# Patient Record
Sex: Female | Born: 1980 | Race: White | Hispanic: No | Smoking: Never smoker
Health system: Southern US, Community
[De-identification: ages and names within clinical notes are randomized; demographics above are authoritative.]

## PROBLEM LIST (undated history)

## (undated) DIAGNOSIS — T7840XA Allergy, unspecified, initial encounter: Secondary | ICD-10-CM

## (undated) DIAGNOSIS — F419 Anxiety disorder, unspecified: Secondary | ICD-10-CM

## (undated) HISTORY — PX: NO PAST SURGERIES: SHX2092

## (undated) HISTORY — DX: Anxiety disorder, unspecified: F41.9

## (undated) HISTORY — DX: Allergy, unspecified, initial encounter: T78.40XA

---

## 2000-12-20 ENCOUNTER — Other Ambulatory Visit: Admission: RE | Admit: 2000-12-20 | Discharge: 2000-12-20 | Payer: Self-pay | Admitting: Internal Medicine

## 2013-04-10 LAB — HM PAP SMEAR

## 2013-06-12 ENCOUNTER — Ambulatory Visit: Payer: Self-pay | Admitting: Family Medicine

## 2015-01-10 ENCOUNTER — Telehealth: Payer: Self-pay | Admitting: Family Medicine

## 2015-01-10 DIAGNOSIS — F4323 Adjustment disorder with mixed anxiety and depressed mood: Secondary | ICD-10-CM

## 2015-01-10 NOTE — Telephone Encounter (Signed)
Are you ok with one of us sending her in some Xanax. I was going to call but big surprise  I am running really far behind. Thanks.

## 2015-01-10 NOTE — Telephone Encounter (Signed)
Spoke with patients sister over the phone.  They are ok waiting until tomorrow for Rx as it is after 5pm.  She is going to pick up an OTC sleep aid to see if that helps her sister sleep tonight.

## 2015-01-10 NOTE — Telephone Encounter (Signed)
Pt's sister Sue Lushndrea is calling because she would like to speak with a nurse. Pt's husband killed himself last night and pt is having problems dealing with his death. Pt's sister scheduled an appt with PA for tomorrow 01/11/15 but was hoping we could do something for pt before then. Pt hasn't been seen in office since 06/12/13. Thanks TNP

## 2015-01-11 ENCOUNTER — Ambulatory Visit (INDEPENDENT_AMBULATORY_CARE_PROVIDER_SITE_OTHER): Payer: BC Managed Care – PPO | Admitting: Physician Assistant

## 2015-01-11 ENCOUNTER — Encounter: Payer: Self-pay | Admitting: Physician Assistant

## 2015-01-11 VITALS — BP 122/70 | HR 70 | Temp 98.1°F | Resp 16 | Wt 118.6 lb

## 2015-01-11 DIAGNOSIS — F4321 Adjustment disorder with depressed mood: Secondary | ICD-10-CM | POA: Diagnosis not present

## 2015-01-11 DIAGNOSIS — F4323 Adjustment disorder with mixed anxiety and depressed mood: Secondary | ICD-10-CM | POA: Diagnosis not present

## 2015-01-11 DIAGNOSIS — K649 Unspecified hemorrhoids: Secondary | ICD-10-CM | POA: Insufficient documentation

## 2015-01-11 MED ORDER — ALPRAZOLAM 0.25 MG PO TABS: ORAL_TABLET | ORAL | Status: DC

## 2015-01-11 NOTE — Progress Notes (Signed)
   Subjective:    Patient ID: Erin Vance, female    DOB: 11/19/1980, 34 y.o.   MRN: [id number]  HPI Comments: Patient's husband committed suicide on Sunday 01/09/15 with her in the house.  They were on a trial separation and had been seeing a marriage counselor.  He also had a history of alcohol abuse.  He had been drinking all day on Sunday 01/09/15 when she went over to their house.  She has been busy with funeral arrangements and has not really focused on the grieving situation yet.  Her sister accompanies her today and states she was not sleeping well, but finally slept some last night (01/10/15) and that she has not been eating well.  Anxiety Presents for initial visit. Onset was in the past 7 days. The problem has been waxing and waning. Symptoms include depressed mood, excessive worry, insomnia and nervous/anxious behavior. Patient reports no chest pain, compulsions, confusion, decreased concentration, dizziness, dry mouth, feeling of choking, hyperventilation, impotence, irritability, malaise, muscle tension, nausea, obsessions, palpitations, panic, restlessness, shortness of breath or suicidal ideas. Symptoms occur occasionally. The severity of symptoms is moderate. The symptoms are aggravated by family issues. Hours of sleep per night: last night she was able to sleep for the first time since Sunday 01/09/15. The quality of sleep is fair. Nighttime awakenings: none.   Risk factors include a major life event and prior traumatic experience. There is no history of anemia, anxiety/panic attacks, arrhythmia, asthma, bipolar disorder, CAD, CHF, chronic lung disease, depression, fibromyalgia, hyperthyroidism or suicide attempts. Past treatments include nothing.      Review of Systems  Constitutional: Positive for appetite change (no appetite). Negative for irritability, fatigue and unexpected weight change.  Respiratory: Negative for chest tightness and shortness of breath.   Cardiovascular: Negative  for chest pain and palpitations.  Gastrointestinal: Negative for nausea.  Genitourinary: Negative for impotence.  Neurological: Negative for dizziness.  Psychiatric/Behavioral: Positive for sleep disturbance. Negative for suicidal ideas, confusion, self-injury, decreased concentration and agitation. The patient is nervous/anxious and has insomnia.        Objective:   Physical Exam  Constitutional: She appears well-developed and well-nourished. No distress.  Psychiatric: Her speech is normal and behavior is normal. Judgment and thought content normal. Her mood appears not anxious. Her affect is not angry. Cognition and memory are normal. She exhibits a depressed mood.          Assessment & Plan:  1. Situational mixed anxiety and depressive disorder Husband committed suicide on 01/09/15.  Xanax given for prn anxiety.  She has a Veterinary surgeoncounselor that has agreed to see her.  It is the same counselor that she and her husband were seeing for marriage counseling.  I advised her to call if she needs anything in the meantime.  Will follow up in 2 weeks.  - ALPRAZolam (XANAX) 0.25 MG tablet; Take 1-2 tablets as needed every 4 hours for anxiety  Dispense: 30 tablet; Refill: 0  2. Grief reaction Husband committed suicide on 01/09/15.  Xanax given for prn anxiety.  She has a Veterinary surgeoncounselor that has agreed to see her.  It is the same counselor that she and her husband were seeing for marriage counseling.  I advised her to call if she needs anything in the meantime.  Will follow up in 2 weeks.

## 2015-01-11 NOTE — Patient Instructions (Signed)
Grief Reaction Grief is a normal response to the death of someone close to you. Feelings of fear, anger, and guilt can affect almost everyone who loses someone they love. Symptoms of depression are also common. These include problems with sleep, loss of appetite, and lack of energy. These grief reaction symptoms often last for weeks to months after a loss. They may also return during special times that remind you of the person you lost, such as an anniversary or birthday. Anxiety, insomnia, irritability, and deep depression may last beyond the period of normal grief. If you experience these feelings for 6 months or longer, you may have clinical depression. Clinical depression requires further medical attention. If you think that you have clinical depression, you should contact your caregiver. If you have a history of depression or a family history of depression, you are at greater risk of clinical depression. You are also at greater risk of developing clinical depression if the loss was traumatic or the loss was of someone with whom you had unresolved issues.  A grief reaction can become complicated by being blocked. This means being unable to cry or express extreme emotions. This may prolong the grieving period and worsen the emotional effects of the loss. Mourning is a natural event in human life. A healthy grief reaction is one that is not blocked. It requires a time of sadness and readjustment. It is very important to share your sorrow and fear with others, especially close friends and family. Professional counselors and clergy can also help you process your grief. Document Released: 07/23/2005 Document Revised: 12/07/2013 Document Reviewed: 04/02/2006 Exit[employer] Patient Information 2015 [employer]. This information is not intended to replace advice given to you by your health care provider. Make sure you discuss any questions you have with your health care provider.  

## 2015-01-26 ENCOUNTER — Ambulatory Visit (INDEPENDENT_AMBULATORY_CARE_PROVIDER_SITE_OTHER): Payer: BC Managed Care – PPO | Admitting: Physician Assistant

## 2015-01-26 ENCOUNTER — Encounter: Payer: Self-pay | Admitting: Physician Assistant

## 2015-01-26 VITALS — BP 118/60 | HR 80 | Temp 98.1°F | Resp 16 | Ht 61.5 in | Wt 114.6 lb

## 2015-01-26 DIAGNOSIS — F4323 Adjustment disorder with mixed anxiety and depressed mood: Secondary | ICD-10-CM

## 2015-01-26 DIAGNOSIS — Z0000 Encounter for general adult medical examination without abnormal findings: Secondary | ICD-10-CM | POA: Diagnosis not present

## 2015-01-26 NOTE — Progress Notes (Signed)
Patient ID: Erin Vance, female   DOB: 27-Feb-1981, 34 y.o.   MRN: [id number]   Patient: Erin Vance, Female    DOB: 06-Jun-1981, 34 y.o.   MRN: [id number] Visit Date: 01/26/2015  Today's Provider: Margaretann Loveless, PA-C   Chief Complaint  Patient presents with  . Annual Exam  . Medication Refill    needs Aprazolam (Xanax) 0.25 MG tablet   Subjective:    Annual physical exam Erin Vance is a 34 y.o. female who presents today for health maintenance and complete physical. She feels fairly well. She reports not exercising. She reports she is sleeping well, sleeping 7 hours per night.   We discussed in detail how she has been doing since the death of her husband.  She is coping well.  She reports not having to use the xanax except for once since she was given the Rx.  She does report having one flashback of the incident when she was getting her hair done.  The flashback was of her husbands grandfather spraying the driveway off.  She is also still seeing her counselor as well. -----------------------------------------------------------------   Review of Systems  Constitutional: Negative.   HENT: Negative.   Eyes: Negative.   Respiratory: Negative.   Cardiovascular: Negative.   Gastrointestinal: Negative.   Endocrine: Negative.   Genitourinary: Negative.   Musculoskeletal: Negative.   Skin: Negative.   Allergic/Immunologic: Negative.   Neurological: Negative.   Hematological: Negative.   Psychiatric/Behavioral: Negative.     Social History She  reports that she has never smoked. She does not have any smokeless tobacco history on file. She reports that she drinks alcohol. She reports that she does not use illicit drugs.  Patient Active Problem List   Diagnosis Date Noted  . Hemorrhoid 01/11/2015  . Situational mixed anxiety and depressive disorder 01/11/2015    Past Surgical History  Procedure Laterality Date  . No past surgeries      Family History Her family  history includes Bell's palsy in her sister; Breast cancer in her maternal grandmother; Diabetes in her sister; Heart attack in her father and paternal grandfather; Hypertension in her mother; Kidney cancer (age of onset: 29) in her mother.    Previous Medications   ALPRAZOLAM (XANAX) 0.25 MG TABLET    Take 1-2 tablets as needed every 4 hours for anxiety    Patient Care Team: Margaretann Loveless, PA-C as PCP - General (Physician Assistant)     Objective:   Vitals: BP 118/60 mmHg  Pulse 80  Temp(Src) 98.1 F (36.7 C) (Oral)  Resp 16  Ht 5' 1.5" (1.562 m)  Wt 114 lb 9.6 oz (51.982 kg)  BMI 21.31 kg/m2  LMP 12/11/2014   Physical Exam  Constitutional: She is oriented to person, place, and time. She appears well-developed and well-nourished. No distress.  HENT:  Head: Normocephalic and atraumatic.  Right Ear: Hearing, tympanic membrane, external ear and ear canal normal.  Left Ear: Hearing, tympanic membrane, external ear and ear canal normal.  Nose: Nose normal.  Mouth/Throat: Uvula is midline, oropharynx is clear and moist and mucous membranes are normal. No oropharyngeal exudate.  Eyes: Conjunctivae are normal. Pupils are equal, round, and reactive to light. Right eye exhibits no discharge. Left eye exhibits no discharge. No scleral icterus.  Neck: Normal range of motion. Neck supple. No tracheal deviation present. No thyromegaly present.  Cardiovascular: Normal rate, regular rhythm, normal heart sounds and intact distal pulses.  Exam reveals no gallop and no friction rub.  No murmur heard. Pulmonary/Chest: Effort normal and breath sounds normal. No respiratory distress. She has no wheezes. She has no rales. She exhibits no tenderness.  Abdominal: Soft. Bowel sounds are normal. She exhibits no distension and no mass. There is no tenderness. There is no rebound and no guarding.  Musculoskeletal: Normal range of motion. She exhibits no edema or tenderness.  Lymphadenopathy:    She  has no cervical adenopathy.  Neurological: She is alert and oriented to person, place, and time. No cranial nerve deficit.  Skin: Skin is warm and dry. No rash noted. She is not diaphoretic.  Psychiatric: She has a normal mood and affect. Her speech is normal and behavior is normal. Judgment and thought content normal. Cognition and memory are normal.  Vitals reviewed.    Depression Screen PHQ 2/9 Scores 01/26/2015  PHQ - 2 Score 0      Assessment & Plan:     Routine Health Maintenance and Physical Exam  Exercise Activities and Dietary recommendations Goals    None      Immunization History  Administered Date(s) Administered  . HPV Quadrivalent 07/12/2006, 09/19/2006, 01/22/2007  . Tdap 07/12/2006    Health Maintenance  Topic Date Due  . HIV Screening  08/14/1995  . INFLUENZA VACCINE  03/07/2015  . PAP SMEAR  04/10/2016  . TETANUS/TDAP  07/12/2016      Discussed health benefits of physical activity, and encouraged her to engage in regular exercise appropriate for her age and condition.   1. Routine general medical examination at a health care facility Pap deferred to next year.  2. Situational mixed anxiety and depressive disorder Seems to be coping appropriately.  Will follow up in 4 weeks.    --------------------------------------------------------------------

## 2015-01-26 NOTE — Patient Instructions (Signed)

## 2015-02-23 ENCOUNTER — Ambulatory Visit (INDEPENDENT_AMBULATORY_CARE_PROVIDER_SITE_OTHER): Payer: BC Managed Care – PPO | Admitting: Physician Assistant

## 2015-02-23 ENCOUNTER — Encounter: Payer: Self-pay | Admitting: Physician Assistant

## 2015-02-23 VITALS — BP 102/64 | HR 60 | Temp 98.3°F | Resp 14 | Ht 68.0 in | Wt 116.8 lb

## 2015-02-23 DIAGNOSIS — F4321 Adjustment disorder with depressed mood: Secondary | ICD-10-CM | POA: Diagnosis not present

## 2015-02-23 DIAGNOSIS — F4323 Adjustment disorder with mixed anxiety and depressed mood: Secondary | ICD-10-CM | POA: Diagnosis not present

## 2015-02-23 NOTE — Progress Notes (Signed)
Subjective:    Patient ID: Erin Vance, female    DOB: Jun 03, 1981, 34 y.o.   MRN: [id number]  HPI Comments: Patient's husband committed suicide on Sunday 01/09/15 with her in the house.  They were on a trial separation and had been seeing a marriage counselor.  He also had a history of alcohol abuse.  He had been drinking all day on Sunday 01/09/15 when she went over to their house.  She has been busy with funeral arrangements and has not really focused on the grieving situation yet.  Her sister accompanies her today and states she was not sleeping well, but finally slept some last night (01/10/15) and that she has not been eating well.  Anxiety Presents for follow-up visit. Onset was in the past 7 days. The problem has been waxing and waning. Symptoms include depressed mood, excessive worry, insomnia and nervous/anxious behavior. Patient reports no chest pain, compulsions, confusion, decreased concentration, dizziness, dry mouth, feeling of choking, hyperventilation, impotence, irritability, malaise, muscle tension, nausea, obsessions, palpitations, panic, restlessness, shortness of breath or suicidal ideas. Symptoms occur occasionally. The severity of symptoms is moderate. The symptoms are aggravated by family issues. Hours of sleep per night: last night she was able to sleep for the first time since Sunday 01/09/15. The quality of sleep is fair. Nighttime awakenings: none.   Risk factors include a major life event and prior traumatic experience. There is no history of anemia, anxiety/panic attacks, arrhythmia, asthma, bipolar disorder, CAD, CHF, chronic lung disease, depression, fibromyalgia, hyperthyroidism or suicide attempts. Past treatments include nothing. Compliance with medications is 0-25%.   UPDATE:  She feels she is doing ok.  She reports having episodes of sadness daily, but they are fleeting.  She does have a good support system with friends and family.  She continues to see the grief  counselor weekly.  She has not been taking the xanax.  She only used 2 pills total.  Currently she has been thinking a lot about the "what ifs" and if anything could have changed the outcome.  She knows and understands these thoughts will not, nor can they change the outcome, but she can't help fixating on them.  She is trying to stay busy.  She is continuing to have sleep issues stating she stays awake and stays busy until almost exhaustion around 12am to 1am.  She will then fall asleep and sometimes sleep through the night until around 7am and sometimes she will awake in the middle of the night.  She does state she is able to fall back asleep though.   Review of Systems  Constitutional: Negative for appetite change (improved some), irritability, fatigue and unexpected weight change.  Respiratory: Negative for chest tightness and shortness of breath.   Cardiovascular: Negative for chest pain and palpitations.  Gastrointestinal: Negative for nausea.  Genitourinary: Negative for impotence.  Neurological: Negative for dizziness.  Psychiatric/Behavioral: Positive for sleep disturbance. Negative for suicidal ideas, confusion, self-injury, decreased concentration and agitation. The patient is nervous/anxious and has insomnia.        Objective:   Physical Exam  Constitutional: She appears well-developed and well-nourished. No distress.  Skin: She is not diaphoretic.  Psychiatric: Her speech is normal and behavior is normal. Judgment and thought content normal. Her mood appears not anxious. Her affect is not angry. Cognition and memory are normal. She exhibits a depressed mood.  She seems to be doing well.  She has occasional tearfulness during the interview.  She does have some anger  towards her husbands family and some blame.  She is asking a lot of "what if" questions.  She does continue to want to have a relationship with them, and has been trying to reach out to them without much response.           Assessment & Plan:  1. Situational mixed anxiety and depressive disorder Husband committed suicide on 01/09/15.  Xanax given for prn anxiety.  She has a Veterinary surgeoncounselor that has agreed to see her.  It is the same counselor that she and her husband were seeing for marriage counseling.  I advised her to call if she needs anything in the meantime.  Will follow up in 4 weeks.  2. Grief reaction See above.

## 2015-02-23 NOTE — Patient Instructions (Signed)
Grief Reaction Grief is a normal response to the death of someone close to you. Feelings of fear, anger, and guilt can affect almost everyone who loses someone they love. Symptoms of depression are also common. These include problems with sleep, loss of appetite, and lack of energy. These grief reaction symptoms often last for weeks to months after a loss. They may also return during special times that remind you of the person you lost, such as an anniversary or birthday. Anxiety, insomnia, irritability, and deep depression may last beyond the period of normal grief. If you experience these feelings for 6 months or longer, you may have clinical depression. Clinical depression requires further medical attention. If you think that you have clinical depression, you should contact your caregiver. If you have a history of depression or a family history of depression, you are at greater risk of clinical depression. You are also at greater risk of developing clinical depression if the loss was traumatic or the loss was of someone with whom you had unresolved issues.  A grief reaction can become complicated by being blocked. This means being unable to cry or express extreme emotions. This may prolong the grieving period and worsen the emotional effects of the loss. Mourning is a natural event in human life. A healthy grief reaction is one that is not blocked. It requires a time of sadness and readjustment. It is very important to share your sorrow and fear with others, especially close friends and family. Professional counselors and clergy can also help you process your grief. Document Released: 07/23/2005 Document Revised: 12/07/2013 Document Reviewed: 04/02/2006 Exit[employer] Patient Information 2015 [employer]. This information is not intended to replace advice given to you by your health care provider. Make sure you discuss any questions you have with your health care provider.  

## 2015-03-11 ENCOUNTER — Ambulatory Visit (INDEPENDENT_AMBULATORY_CARE_PROVIDER_SITE_OTHER): Payer: BC Managed Care – PPO | Admitting: Physician Assistant

## 2015-03-11 ENCOUNTER — Encounter: Payer: Self-pay | Admitting: Physician Assistant

## 2015-03-11 VITALS — BP 100/60 | HR 66 | Temp 97.5°F | Resp 16 | Wt 116.2 lb

## 2015-03-11 DIAGNOSIS — Z01419 Encounter for gynecological examination (general) (routine) without abnormal findings: Secondary | ICD-10-CM

## 2015-03-11 DIAGNOSIS — Z124 Encounter for screening for malignant neoplasm of cervix: Secondary | ICD-10-CM

## 2015-03-11 DIAGNOSIS — F4323 Adjustment disorder with mixed anxiety and depressed mood: Secondary | ICD-10-CM

## 2015-03-11 MED ORDER — ALPRAZOLAM 0.25 MG PO TABS: ORAL_TABLET | ORAL | Status: DC

## 2015-03-11 NOTE — Patient Instructions (Signed)
Grief Reaction Grief is a normal response to the death of someone close to you. Feelings of fear, anger, and guilt can affect almost everyone who loses someone they love. Symptoms of depression are also common. These include problems with sleep, loss of appetite, and lack of energy. These grief reaction symptoms often last for weeks to months after a loss. They may also return during special times that remind you of the person you lost, such as an anniversary or birthday. Anxiety, insomnia, irritability, and deep depression may last beyond the period of normal grief. If you experience these feelings for 6 months or longer, you may have clinical depression. Clinical depression requires further medical attention. If you think that you have clinical depression, you should contact your caregiver. If you have a history of depression or a family history of depression, you are at greater risk of clinical depression. You are also at greater risk of developing clinical depression if the loss was traumatic or the loss was of someone with whom you had unresolved issues.  A grief reaction can become complicated by being blocked. This means being unable to cry or express extreme emotions. This may prolong the grieving period and worsen the emotional effects of the loss. Mourning is a natural event in human life. A healthy grief reaction is one that is not blocked. It requires a time of sadness and readjustment. It is very important to share your sorrow and fear with others, especially close friends and family. Professional counselors and clergy can also help you process your grief. Document Released: 07/23/2005 Document Revised: 12/07/2013 Document Reviewed: 04/02/2006 Exit[employer] Patient Information 2015 [employer]. This information is not intended to replace advice given to you by your health care provider. Make sure you discuss any questions you have with your health care provider.  

## 2015-03-11 NOTE — Progress Notes (Signed)
Patient: Erin Vance Female    DOB: 28-May-1981   34 y.o.   MRN: [id number] Visit Date: 03/11/2015  Today's Provider: Margaretann Loveless, PA-C   Chief Complaint  Patient presents with  . Follow-up    Depression and Anxiety   Subjective:    Anxiety Presents for follow-up visit. Onset was 1 to 6 months ago. The problem has been rapidly improving. Symptoms include depressed mood (sometimes), insomnia (sometimes) and nervous/anxious behavior. Patient reports no chest pain, confusion, decreased concentration, dizziness, dry mouth, excessive worry, irritability, malaise, nausea, panic, restlessness, shortness of breath or suicidal ideas. Symptoms occur rarely. The most recent episode lasted 2 minutes. The severity of symptoms is mild. Nothing aggravates the symptoms. The quality of sleep is fair.    Gynecologic Exam The patient's pertinent negatives include no genital itching, genital lesions, genital odor, genital rash, missed menses, pelvic pain, vaginal bleeding or vaginal discharge. The patient is experiencing no pain. She is not pregnant. Pertinent negatives include no abdominal pain, anorexia, back pain, chills, constipation, diarrhea, discolored urine, dysuria, fever, flank pain, frequency, headaches, hematuria, joint pain, joint swelling, nausea, painful intercourse, rash, sore throat, urgency or vomiting. She is not sexually active. Her menstrual history has been regular. There is no history of an abdominal surgery, a Cesarean section, an ectopic pregnancy, endometriosis, a gynecological surgery, herpes simplex, menorrhagia, metrorrhagia, miscarriage, ovarian cysts, perineal abscess, PID, an STD, a terminated pregnancy or vaginosis.       Allergies  Allergen Reactions  . Cephalexin    Previous Medications   ALPRAZOLAM (XANAX) 0.25 MG TABLET    Take 1-2 tablets as needed every 4 hours for anxiety    Review of Systems  Constitutional: Negative.  Negative for fever, chills  and irritability.  HENT: Negative.  Negative for sore throat.   Eyes: Negative.   Respiratory: Negative.  Negative for shortness of breath.   Cardiovascular: Negative.  Negative for chest pain.  Gastrointestinal: Negative.  Negative for nausea, vomiting, abdominal pain, diarrhea, constipation and anorexia.  Endocrine: Negative.   Genitourinary: Negative.  Negative for dysuria, urgency, frequency, hematuria, flank pain, vaginal discharge, pelvic pain, menorrhagia and missed menses.  Musculoskeletal: Negative for back pain and joint pain.  Skin: Negative.  Negative for rash.  Allergic/Immunologic: Negative.   Neurological: Negative.  Negative for dizziness and headaches.  Hematological: Negative.   Psychiatric/Behavioral: Negative for suicidal ideas, confusion, self-injury and decreased concentration. The patient is nervous/anxious and has insomnia (sometimes).     History  Substance Use Topics  . Smoking status: Never Smoker   . Smokeless tobacco: Not on file  . Alcohol Use: 0.0 oz/week    0 Standard drinks or equivalent per week     Comment: occasionally   Objective:   BP 100/60 mmHg  Pulse 66  Temp(Src) 97.5 F (36.4 C) (Oral)  Resp 16  Wt 116 lb 3.2 oz (52.708 kg)  LMP 02/20/2015 (Exact Date)  Physical Exam  Constitutional: She appears well-developed and well-nourished. No distress.  Pulmonary/Chest: Right breast exhibits no inverted nipple, no mass, no nipple discharge, no skin change and no tenderness. Left breast exhibits no inverted nipple, no mass, no nipple discharge, no skin change and no tenderness. Breasts are symmetrical.  Breast tissue is dense bilaterally  Genitourinary: Rectum normal, vagina normal and uterus normal. Pelvic exam was performed with patient supine. No labial fusion. There is no rash, tenderness, lesion or injury on the right labia. There is no rash, tenderness, lesion  or injury on the left labia. Uterus is not deviated, not enlarged, not fixed and  not tender. Cervix exhibits no motion tenderness, no discharge and no friability. Right adnexum displays no mass, no tenderness and no fullness. Left adnexum displays no mass, no tenderness and no fullness. No vaginal discharge found.  Skin: She is not diaphoretic.  Psychiatric: She has a normal mood and affect. Her behavior is normal. Judgment and thought content normal.  Vitals reviewed.       Assessment & Plan:     1. Encounter for gynecological examination At last annual exam we have decided to defer her Pap smear until the following year due to the current situation that she was in. At most recent visit we decided that we would go ahead and do the Pap smear this year as she was feeling okay and was ready to have it done, and that she was due for her Pap smear. Pap smear was done today in the office. I will call her with the results once I receive them. - Pap IG w/ reflex to HPV when [facility]-U  2. Situational mixed anxiety and depressive disorder Stable. She still has good days and bad days. She has been using the Xanax a little more frequently when she feels nervous and anxious about an upcoming activity. She still takes them very infrequently stating that she has only taken 3 pills since I last saw her. Each time she took them was due to having increased anxiety for different social situations. She does feel that she may require it a little more frequently once school starts back. Will refill Xanax as below. We'll follow-up in 2 months to see how she is doing with starting back work. She is to call the office if she has any worsening symptoms, suicidal thoughts, or severe depression for follow-up if needed sooner. - ALPRAZolam (XANAX) 0.25 MG tablet; Take 1-2 tablets as needed every 4 hours for anxiety  Dispense: 30 tablet; Refill: 0  3. Cervical cancer screening - Pap IG w/ reflex to HPV when [facility]       Margaretann Loveless, PA-C  [facility]

## 2015-03-16 ENCOUNTER — Telehealth: Payer: Self-pay

## 2015-03-16 LAB — PAP IG W/ RFLX HPV ASCU: PAP Smear Comment: 0

## 2015-03-16 NOTE — Telephone Encounter (Signed)
-----   Message from Margaretann Loveless, [place] sent at 03/16/2015  1:46 PM EDT ----- Normal pap smear.

## 2015-03-16 NOTE — Telephone Encounter (Signed)
No answer un able to LM. 03/16/2015  Thanks,  -Joseline

## 2015-03-16 NOTE — Telephone Encounter (Signed)
Patient advised as directed below.  Thanks,  -Joseline 

## 2015-05-25 ENCOUNTER — Ambulatory Visit (INDEPENDENT_AMBULATORY_CARE_PROVIDER_SITE_OTHER): Payer: BC Managed Care – PPO | Admitting: Physician Assistant

## 2015-05-25 ENCOUNTER — Encounter: Payer: Self-pay | Admitting: Physician Assistant

## 2015-05-25 VITALS — BP 112/68 | HR 70 | Temp 98.2°F | Resp 16 | Wt 120.8 lb

## 2015-05-25 DIAGNOSIS — F4323 Adjustment disorder with mixed anxiety and depressed mood: Secondary | ICD-10-CM | POA: Diagnosis not present

## 2015-05-25 DIAGNOSIS — G479 Sleep disorder, unspecified: Secondary | ICD-10-CM

## 2015-05-25 DIAGNOSIS — F4321 Adjustment disorder with depressed mood: Secondary | ICD-10-CM | POA: Diagnosis not present

## 2015-05-25 MED ORDER — ALPRAZOLAM 0.25 MG PO TABS: ORAL_TABLET | ORAL | Status: DC

## 2015-05-25 MED ORDER — ESCITALOPRAM OXALATE 10 MG PO TABS: ORAL_TABLET | ORAL | Status: DC

## 2015-05-25 NOTE — Progress Notes (Signed)
Patient: Erin Vance Female    DOB: 03/23/1981   34 y.o.   MRN: [id number] Visit Date: 05/25/2015  Today's Provider: Margaretann LovelessJennifer M Burnette, PA-C   Chief Complaint  Patient presents with  . Anxiety   Subjective:    HPI    Anxiety: Patient following on anxiety.  She has the following symptoms: none. Onset of symptoms was approximately 4 months ago, unchanged since last office visit. She denies current suicidal and homicidal ideation. Family history significant for none.Previous treatment includes Xanax  She complains of the following side effects from the treatment: none. Per patient some days starts to cry, feeling sad, very emotional. Patient went back to work, but states that has the flash backs and gets upset but is not work related just the memories.Patient is taking Xanax as needed.   She states that she felt she was doing fairly well but on October 5 her husband's headstone was placed in the ground. She states at that time she felt that the finality of his death. Since then she feels like she has been having a harder time coping. She also has had days where she has felt like she did not want to get out of bed. She also has not been going to her counselor in the last couple months due to extracurricular activities with her school. She states that these 1 and in November and she is looking forward to getting back with her counselor. Overall she states she feels she is doing okay but she is interested and medication for depression at this time.  She also continues to have difficulty with sleep. She states that she does have good days and bad days. She has not been taking anything to help her sleep at this time.   Allergies  Allergen Reactions  . Cephalexin    Previous Medications   ALPRAZOLAM (XANAX) 0.25 MG TABLET    Take 1-2 tablets as needed every 4 hours for anxiety    Review of Systems  Constitutional: Negative for activity change, appetite change and unexpected weight  change.  Respiratory: Negative for cough, chest tightness, shortness of breath and wheezing.   Cardiovascular: Negative for chest pain and palpitations.  Gastrointestinal: Negative for nausea and vomiting.  Neurological: Negative for dizziness, syncope, weakness, light-headedness and headaches.  Psychiatric/Behavioral: Positive for dysphoric mood. The patient is nervous/anxious.     Social History  Substance Use Topics  . Smoking status: Never Smoker   . Smokeless tobacco: Not on file  . Alcohol Use: 0.0 oz/week    0 Standard drinks or equivalent per week     Comment: occasionally   Objective:   BP 112/68 mmHg  Pulse 70  Temp(Src) 98.2 F (36.8 C) (Oral)  Resp 16  Wt 120 lb 12.8 oz (54.795 kg)  LMP 04/25/2015  Physical Exam  Constitutional: She appears well-developed and well-nourished. No distress.  Cardiovascular: Normal rate, regular rhythm and normal heart sounds.  Exam reveals no gallop and no friction rub.   No murmur heard. Pulmonary/Chest: Effort normal and breath sounds normal. No respiratory distress. She has no wheezes. She has no rales.  Skin: She is not diaphoretic.  Psychiatric: Her speech is normal and behavior is normal. Judgment and thought content normal. Her mood appears anxious. Cognition and memory are normal. She exhibits a depressed mood. She expresses no homicidal and no suicidal ideation.  Vitals reviewed.       Assessment & Plan:     1. Situational  mixed anxiety and depressive disorder Advised her to start taking the Xanax nightly to help her sleep. I will also prescribe her Lexapro as below for the increased depression symptoms that she is starting to develop. She is to take this at night as well. I do hope that this will help with her trouble sleeping. We also discussed her getting back him off with her counselor. She agrees to do this. We also discussed possibly looking into grief groups to help her with coping strategies. We will readdress this in  4 weeks when she returns for follow-up. At that time if she is interested or if any time prior to that visit she decides she would like to look into grief counseling groups I will assist her with this. I will have her return in 4 weeks to see how she is doing with the Lexapro and Xanax at night. She is to call the office if she has any side effects or worsening of her symptoms in the meantime. - ALPRAZolam (XANAX) 0.25 MG tablet; Take 1-2 tablets as needed every 4 hours for anxiety  Dispense: 30 tablet; Refill: 1 - escitalopram (LEXAPRO) 10 MG tablet; Take 1/2 tab PO q h.s. X 1 week, then 1 tab PO q h.s.  Dispense: 30 tablet; Refill: 1  2. Grief reaction See above medical treatment plan.  3. Trouble in sleeping See above medical treatment plan.  I spent approximately 45 minutes with the patient today. Over 50% of this time was spent with counseling and educating the patient.       Margaretann Loveless, PA-C  [facility]

## 2015-05-25 NOTE — Patient Instructions (Addendum)
Complicated Grieving Grief is a normal response to the death of someone close to you. Feelings of fear, anger, and guilt can affect almost everyone who loses a loved one. It is also common to have symptoms of depression while you are grieving. These include problems with sleep, loss of appetite, and lack of energy. They may last for weeks or months after a loss. Complicated grief is different from normal grief or depression. Normal grieving involves sadness and feelings of loss, but these feelings are not constant. Complicated grief is a constant and severe type of grief. It interferes with your ability to function normally. It may last for several months to a year or longer. Complicated grief may require treatment from a mental health care provider. CAUSES  It is not known why some people continue to struggle with grief and others do not. You may be at higher risk for complicated grief if:  The death of your loved one was sudden or unexpected.  The death of your loved one was due to a violent event.  Your loved one committed suicide.  Your loved one was a child or a young person.  You were very close to or dependent on the loved one.  You have a history of depression. SIGNS AND SYMPTOMS Signs and symptoms of complicated grief may include:  Feeling disbelief or numbness.  Being unable to enjoy good memories of your loved one.  Needing to avoid anything that reminds you of your loved one.  Being unable to stop thinking about the death.  Feeling intense anger or guilt.  Feeling alone and hopeless.  Feeling that your life is meaningless and empty.  Losing the desire to live. DIAGNOSIS Your health care provider may diagnose complicated grief if:  You have constant symptoms of grief for 6-12 months or longer.  Your symptoms are interfering with your ability to live your life. Your health care provider may want you to see a mental health care provider. Many symptoms of depression  are similar to the symptoms of complicated grief. It is important to be evaluated for complicated grief along with other mental health conditions. TREATMENT  Talk therapy with a mental health provider is the most common treatment for complicated grief. During therapy, you will learn healthy ways to cope with the loss of your loved one. In some cases, your mental health care provider may also recommend antidepressant medicines. HOME CARE INSTRUCTIONS  Take care of yourself.  Eat regular meals and maintain a healthy diet. Eat plenty of fruits, vegetables, and whole grains.  Try to get some exercise each day.  Keep regular hours for sleep. Try to get at least 8 hours of sleep each night.  Do not use drugs or alcohol to ease your symptoms.  Take medicines only as directed by your health care provider.  Spend time with friends and loved ones.  Consider joining a grief (bereavement) support group to help you deal with your loss.  Keep all follow-up visits as directed by your health care provider. This is important. SEEK MEDICAL CARE IF:  Your symptoms keep you from functioning normally.  Your symptoms do not get better with treatment. SEEK IMMEDIATE MEDICAL CARE IF:  You have serious thoughts of hurting yourself or someone else.  You have suicidal feelings.   This information is not intended to replace advice given to you by your health care provider. Make sure you discuss any questions you have with your health care provider.   Document Released: 07/23/2005  Document Revised: 04/13/2015 Document Reviewed: 12/31/2013 Elsevier Interactive Patient Education 2016 Elsevier Inc.  Escitalopram tablets What is this medicine? ESCITALOPRAM (es sye TAL oh pram) is used to treat depression and certain types of anxiety. This medicine may be used for other purposes; ask your health care provider or pharmacist if you have questions. What should I tell my health care provider before I take this  medicine? They need to know if you have any of these conditions: -bipolar disorder or a family history of bipolar disorder -diabetes -glaucoma -heart disease -kidney or liver disease -receiving electroconvulsive therapy -seizures (convulsions) -suicidal thoughts, plans, or attempt by you or a family member -an unusual or allergic reaction to escitalopram, the related drug citalopram, other medicines, foods, dyes, or preservatives -pregnant or trying to become pregnant -breast-feeding How should I use this medicine? Take this medicine by mouth with a glass of water. Follow the directions on the prescription label. You can take it with or without food. If it upsets your stomach, take it with food. Take your medicine at regular intervals. Do not take it more often than directed. Do not stop taking this medicine suddenly except upon the advice of your doctor. Stopping this medicine too quickly may cause serious side effects or your condition may worsen. A special MedGuide will be given to you by the pharmacist with each prescription and refill. Be sure to read this information carefully each time. Talk to your pediatrician regarding the use of this medicine in children. Special care may be needed. Overdosage: If you think you have taken too much of this medicine contact a poison control center or emergency room at once. NOTE: This medicine is only for you. Do not share this medicine with others. What if I miss a dose? If you miss a dose, take it as soon as you can. If it is almost time for your next dose, take only that dose. Do not take double or extra doses. What may interact with this medicine? Do not take this medicine with any of the following medications: -certain medicines for fungal infections like fluconazole, itraconazole, ketoconazole, posaconazole, voriconazole -cisapride -citalopram -dofetilide -dronedarone -linezolid -MAOIs like Carbex, Eldepryl, Marplan, Nardil, and  Parnate -methylene blue (injected into a vein) -pimozide -thioridazine -ziprasidone This medicine may also interact with the following medications: -alcohol -aspirin and aspirin-like medicines -carbamazepine -certain medicines for depression, anxiety, or psychotic disturbances -certain medicines for migraine headache like almotriptan, eletriptan, frovatriptan, naratriptan, rizatriptan, sumatriptan, zolmitriptan -certain medicines for sleep -certain medicines that treat or prevent blood clots like warfarin, enoxaparin, dalteparin -cimetidine -diuretics -fentanyl -furazolidone -isoniazid -lithium -metoprolol -NSAIDs, medicines for pain and inflammation, like ibuprofen or naproxen -other medicines that prolong the QT interval (cause an abnormal heart rhythm) -procarbazine -rasagiline -supplements like St. John's wort, kava kava, valerian -tramadol -tryptophan This list may not describe all possible interactions. Give your health care provider a list of all the medicines, herbs, non-prescription drugs, or dietary supplements you use. Also tell them if you smoke, drink alcohol, or use illegal drugs. Some items may interact with your medicine. What should I watch for while using this medicine? Tell your doctor if your symptoms do not get better or if they get worse. Visit your doctor or health care professional for regular checks on your progress. Because it may take several weeks to see the full effects of this medicine, it is important to continue your treatment as prescribed by your doctor. Patients and their families should watch out for new or worsening  thoughts of suicide or depression. Also watch out for sudden changes in feelings such as feeling anxious, agitated, panicky, irritable, hostile, aggressive, impulsive, severely restless, overly excited and hyperactive, or not being able to sleep. If this happens, especially at the beginning of treatment or after a change in dose, call  your health care professional. Bonita Quin may get drowsy or dizzy. Do not drive, use machinery, or do anything that needs mental alertness until you know how this medicine affects you. Do not stand or sit up quickly, especially if you are an older patient. This reduces the risk of dizzy or fainting spells. Alcohol may interfere with the effect of this medicine. Avoid alcoholic drinks. Your mouth may get dry. Chewing sugarless gum or sucking hard candy, and drinking plenty of water may help. Contact your doctor if the problem does not go away or is severe. What side effects may I notice from receiving this medicine? Side effects that you should report to your doctor or health care professional as soon as possible: -allergic reactions like skin rash, itching or hives, swelling of the face, lips, or tongue -confusion -feeling faint or lightheaded, falls -fast talking and excited feelings or actions that are out of control -hallucination, loss of contact with reality -seizures -suicidal thoughts or other mood changes -unusual bleeding or bruising Side effects that usually do not require medical attention (report to your doctor or health care professional if they continue or are bothersome): -blurred vision -changes in appetite -change in sex drive or performance -headache -increased sweating -nausea This list may not describe all possible side effects. Call your doctor for medical advice about side effects. You may report side effects to FDA at 1-800-FDA-1088. Where should I keep my medicine? Keep out of reach of children. Store at room temperature between 15 and 30 degrees C (59 and 86 degrees F). Throw away any unused medicine after the expiration date. NOTE: This sheet is a summary. It may not cover all possible information. If you have questions about this medicine, talk to your doctor, pharmacist, or health care provider.    2016, Elsevier/Gold Standard. (2013-02-17 12:32:55)

## 2015-06-22 ENCOUNTER — Encounter: Payer: Self-pay | Admitting: Physician Assistant

## 2015-06-22 ENCOUNTER — Ambulatory Visit (INDEPENDENT_AMBULATORY_CARE_PROVIDER_SITE_OTHER): Payer: BC Managed Care – PPO | Admitting: Physician Assistant

## 2015-06-22 VITALS — BP 100/68 | HR 78 | Temp 98.2°F | Resp 16 | Wt 123.3 lb

## 2015-06-22 DIAGNOSIS — F4321 Adjustment disorder with depressed mood: Secondary | ICD-10-CM

## 2015-06-22 DIAGNOSIS — F4323 Adjustment disorder with mixed anxiety and depressed mood: Secondary | ICD-10-CM

## 2015-06-22 NOTE — Patient Instructions (Signed)
Complicated Grieving Grief is a normal response to the death of someone close to you. Feelings of fear, anger, and guilt can affect almost everyone who loses a loved one. It is also common to have symptoms of depression while you are grieving. These include problems with sleep, loss of appetite, and lack of energy. They may last for weeks or months after a loss. Complicated grief is different from normal grief or depression. Normal grieving involves sadness and feelings of loss, but these feelings are not constant. Complicated grief is a constant and severe type of grief. It interferes with your ability to function normally. It may last for several months to a year or longer. Complicated grief may require treatment from a mental health care provider. CAUSES  It is not known why some people continue to struggle with grief and others do not. You may be at higher risk for complicated grief if:  The death of your loved one was sudden or unexpected.  The death of your loved one was due to a violent event.  Your loved one committed suicide.  Your loved one was a child or a young person.  You were very close to or dependent on the loved one.  You have a history of depression. SIGNS AND SYMPTOMS Signs and symptoms of complicated grief may include:  Feeling disbelief or numbness.  Being unable to enjoy good memories of your loved one.  Needing to avoid anything that reminds you of your loved one.  Being unable to stop thinking about the death.  Feeling intense anger or guilt.  Feeling alone and hopeless.  Feeling that your life is meaningless and empty.  Losing the desire to live. DIAGNOSIS Your health care provider may diagnose complicated grief if:  You have constant symptoms of grief for 6-12 months or longer.  Your symptoms are interfering with your ability to live your life. Your health care provider may want you to see a mental health care provider. Many symptoms of depression  are similar to the symptoms of complicated grief. It is important to be evaluated for complicated grief along with other mental health conditions. TREATMENT  Talk therapy with a mental health provider is the most common treatment for complicated grief. During therapy, you will learn healthy ways to cope with the loss of your loved one. In some cases, your mental health care provider may also recommend antidepressant medicines. HOME CARE INSTRUCTIONS  Take care of yourself.  Eat regular meals and maintain a healthy diet. Eat plenty of fruits, vegetables, and whole grains.  Try to get some exercise each day.  Keep regular hours for sleep. Try to get at least 8 hours of sleep each night.  Do not use drugs or alcohol to ease your symptoms.  Take medicines only as directed by your health care provider.  Spend time with friends and loved ones.  Consider joining a grief (bereavement) support group to help you deal with your loss.  Keep all follow-up visits as directed by your health care provider. This is important. SEEK MEDICAL CARE IF:  Your symptoms keep you from functioning normally.  Your symptoms do not get better with treatment. SEEK IMMEDIATE MEDICAL CARE IF:  You have serious thoughts of hurting yourself or someone else.  You have suicidal feelings.   This information is not intended to replace advice given to you by your health care provider. Make sure you discuss any questions you have with your health care provider.   Document Released: 07/23/2005   Document Revised: 04/13/2015 Document Reviewed: 12/31/2013 Elsevier Interactive Patient Education 2016 Elsevier Inc.  

## 2015-06-22 NOTE — Progress Notes (Signed)
Patient: Erin Vance Female    DOB: [birth year]   34 y.o.   MRN: [id number] Visit Date: 06/22/2015  Today's Provider: Margaretann Loveless, PA-C   Chief Complaint  Patient presents with  . Depression    Follow up  . Anxiety   Subjective:    Depression      The patient presents with depression.  Chronicity:  Patient here for her 4 wk follow-up.  The current episode started more than 1 month ago.   The problem occurs daily (Patient states not taking Lexapro for two weeks because is feeling more tired and hard to get up.).  The most recent episode lasted 8 hours (Per patient took the Lexpro on Nov the 8th because was very upset.).    The problem has been gradually improving (Depends on the day) since onset.  Associated symptoms include decreased concentration (not as bad as before), fatigue, insomnia (sometimes), irritable (sometimes), appetite change (eating more) and sad (sometimes).     The symptoms are aggravated by work stress, social issues and family issues.  Compliance with treatment is good.  Past medical history includes anxiety and depression.   Anxiety Presents for follow-up visit. Onset was 1 to 6 months ago. The problem has been gradually improving. Symptoms include decreased concentration (not as bad as before), insomnia (sometimes), irritability and nervous/anxious behavior. Patient reports no excessive worry, panic or shortness of breath. Symptoms occur occasionally (Since last office visit patient has not had any episode). The symptoms are aggravated by family issues, work stress and social activities. The quality of sleep is good. Nighttime awakenings: one to two.   Her past medical history is significant for anxiety/panic attacks and depression. The treatment provided moderate relief. Compliance with prior treatments has been good.        Allergies  Allergen Reactions  . Cephalexin    Previous Medications   ALPRAZOLAM (XANAX) 0.25 MG TABLET    Take 1-2  tablets as needed every 4 hours for anxiety   ESCITALOPRAM (LEXAPRO) 10 MG TABLET    Take 1/2 tab PO q h.s. X 1 week, then 1 tab PO q h.s.    Review of Systems  Constitutional: Positive for appetite change (eating more), irritability and fatigue.  Respiratory: Negative.  Negative for shortness of breath.   Cardiovascular: Negative.   Gastrointestinal: Negative.   Psychiatric/Behavioral: Positive for depression and decreased concentration (not as bad as before). The patient is nervous/anxious and has insomnia (sometimes).     Social History  Substance Use Topics  . Smoking status: Never Smoker   . Smokeless tobacco: Not on file  . Alcohol Use: 0.0 oz/week    0 Standard drinks or equivalent per week     Comment: occasionally   Objective:   BP 100/68 mmHg  Pulse 78  Temp(Src) 98.2 F (36.8 C) (Oral)  Resp 16  Wt 123 lb 4.8 oz (55.929 kg)  LMP 06/04/2015  Physical Exam  Constitutional: She appears well-developed and well-nourished. She is irritable (sometimes).  Cardiovascular: Normal rate, regular rhythm and normal heart sounds.  Exam reveals no gallop and no friction rub.   No murmur heard. Pulmonary/Chest: Effort normal and breath sounds normal. No respiratory distress. She has no wheezes. She has no rales.  Psychiatric: She has a normal mood and affect. Her behavior is normal. Judgment and thought content normal.  Vitals reviewed.       Assessment & Plan:     1. Situational mixed anxiety  and depressive disorder She does feel she is doing well at this time and would like to try to go without an antidepressant. She is been off the Lexapro for approximately 2 weeks now. I agreed as I feel that she had adverse reactions with the Lexapro. She is going to start seeing her counselor again. She states she does feel well at this time. She does use the Xanax only as needed and occasionally to help her sleep. We will continue with the current medical treatment plan. I will see her  back in 3 months to evaluate how she is doing at that time. She is to call the office if she has any worsening symptoms or worrying thoughts.  2. Grief reaction See above medical treatment plan.       Margaretann LovelessJennifer M Kalen Ratajczak, PA-C  [facility]

## 2015-06-29 ENCOUNTER — Telehealth: Payer: Self-pay | Admitting: Physician Assistant

## 2015-06-29 NOTE — Telephone Encounter (Signed)
Patient asked if you would give her a call today, i asked what it was about but she stated that it was private but kinda for her stress  Cb# [phone]

## 2015-06-29 NOTE — Telephone Encounter (Signed)
Spoke with patient over the phone and answered all questions.  She is going to be coming to see me on Friday 07/01/15.

## 2015-07-01 ENCOUNTER — Ambulatory Visit (INDEPENDENT_AMBULATORY_CARE_PROVIDER_SITE_OTHER): Payer: BC Managed Care – PPO | Admitting: Physician Assistant

## 2015-07-01 ENCOUNTER — Encounter: Payer: Self-pay | Admitting: Physician Assistant

## 2015-07-01 VITALS — BP 88/60 | HR 65 | Temp 97.4°F | Resp 16 | Wt 125.4 lb

## 2015-07-01 DIAGNOSIS — N926 Irregular menstruation, unspecified: Secondary | ICD-10-CM | POA: Diagnosis not present

## 2015-07-01 DIAGNOSIS — R140 Abdominal distension (gaseous): Secondary | ICD-10-CM

## 2015-07-01 NOTE — Patient Instructions (Signed)
Secondary Amenorrhea Secondary amenorrhea is the stopping of menstrual flow for 3-6 months in a female who has previously had periods. There are many possible causes. Most of these causes are not serious. Usually, treating the underlying problem causing the loss of menses will return your periods to normal. CAUSES  Some common and uncommon causes of not menstruating include:  Malnutrition.  Low blood sugar (hypoglycemia).  Polycystic ovary disease.  Stress or fear.  Breastfeeding.  Hormone imbalance.  Ovarian failure.  Medicines.  Extreme obesity.  Cystic fibrosis.  Low body weight or drastic weight reduction from any cause.  Early menopause.  Removal of ovaries or uterus.  Contraceptives.  Illness.  Long-term (chronic) illnesses.  Cushing syndrome.  Thyroid problems.  Birth control pills, patches, or vaginal rings for birth control. RISK FACTORS You may be at greater risk of secondary amenorrhea if:  You have a family history of this condition.  You have an eating disorder.  You do athletic training. DIAGNOSIS  A diagnosis is made by your health care provider taking a medical history and doing a physical exam. This will include a pelvic exam to check for problems with your reproductive organs. Pregnancy must be ruled out. Often, numerous blood tests are done to measure different hormones in the body. Urine testing may be done. Specialized exams (ultrasound, CT scan, MRI, or hysteroscopy) may have to be done as well as measuring the body mass index (BMI). TREATMENT  Treatment depends on the cause of the amenorrhea. If an eating disorder is present, this can be treated with an adequate diet and therapy. Chronic illnesses may improve with treatment of the illness. Amenorrhea may be corrected with medicines, lifestyle changes, or surgery. If the amenorrhea cannot be corrected, it is sometimes possible to create a false menstruation with medicines. HOME CARE  INSTRUCTIONS  Maintain a healthy diet.  Manage weight problems.  Exercise regularly but not excessively.  Get adequate sleep.  Manage stress.  Be aware of changes in your menstrual cycle. Keep a record of when your periods occur. Note the date your period starts, how long it lasts, and any problems. SEEK MEDICAL CARE IF: Your symptoms do not get better with treatment.   This information is not intended to replace advice given to you by your health care provider. Make sure you discuss any questions you have with your health care provider.   Document Released: 09/03/2006 Document Revised: 08/13/2014 Document Reviewed: 01/08/2013 Elsevier Interactive Patient Education 2016 Elsevier Inc.  

## 2015-07-01 NOTE — Progress Notes (Addendum)
Patient: Erin Vance Female    DOB: May 03, 1981   34 y.o.   MRN: [id number] Visit Date: 07/01/2015  Today's Provider: Margaretann Loveless, PA-C   Chief Complaint  Patient presents with  . Follow-up   Subjective:    HPI Erin Vance is a 34 year old here concern that she might be pregnant. Patient last menstrual cycle was 05/31/15. She states that she had unprotected sex a couple of weeks ago. She doesn't remember the exact date. Following the sexual encounter she had cramps in her lower abdomen similar to her menstrual cycle but it was too early for her menstrual cycle. She never did start her menstrual cycle at that time. She has no history of ovarian cyst or abnormal Pap smears. She denies any nausea or vomiting. She does feel a bloated sensation in her lower or abdominal region bilaterally. She states it feels like tension across her lower abdomen. She also mentioned that on Sunday she had went to go visit her deceased husband's grandmother to take her some flowers and fruit. She ate some of the fruit and developed a weird taste in her mouth like she had never had before. She has not had anything like that since. She did take an at-home pregnancy test which was negative but is still concerned and would like to be tested. She will be late for her menstrual cycle starting today.    Allergies  Allergen Reactions  . Cephalexin    Previous Medications   ALPRAZOLAM (XANAX) 0.25 MG TABLET    Take 1-2 tablets as needed every 4 hours for anxiety   ESCITALOPRAM (LEXAPRO) 10 MG TABLET    take 1/2 tablet by mouth at bedtime for 1 week - then 1 tablet at bedtime    Review of Systems  Constitutional: Negative.   HENT: Negative.   Respiratory: Negative.   Cardiovascular: Negative.   Gastrointestinal: Positive for abdominal distention. Negative for nausea, vomiting, abdominal pain, diarrhea and constipation.  Genitourinary: Negative.   Neurological: Negative.     Psychiatric/Behavioral: The patient is nervous/anxious.   All other systems reviewed and are negative.   Social History  Substance Use Topics  . Smoking status: Never Smoker   . Smokeless tobacco: Not on file  . Alcohol Use: 0.0 oz/week    0 Standard drinks or equivalent per week     Comment: occasionally   Objective:   BP 88/60 mmHg  Pulse 65  Temp(Src) 97.4 F (36.3 C) (Oral)  Resp 16  Wt 125 lb 6.4 oz (56.881 kg)  LMP 05/31/2015  Physical Exam  Constitutional: She is oriented to person, place, and time. She appears well-developed and well-nourished. No distress.  Cardiovascular: Normal rate, regular rhythm and normal heart sounds.  Exam reveals no gallop and no friction rub.   No murmur heard. Pulmonary/Chest: Effort normal and breath sounds normal. No respiratory distress. She has no wheezes. She has no rales.  Abdominal: Soft. Normal appearance and bowel sounds are normal. She exhibits no distension and no mass. There is no hepatosplenomegaly. There is no tenderness. There is no rebound, no guarding and no CVA tenderness.  Suprapubic tenderness  Neurological: She is alert and oriented to person, place, and time.  Skin: Skin is warm and dry. She is not diaphoretic.  Psychiatric: Her speech is normal and behavior is normal. Judgment and thought content normal. Her mood appears anxious. Cognition and memory are normal.        Assessment & Plan:  1. Missed menses I will check hCG as below. I will follow-up with her pending the lab results. If labs are positive I will see her back to discuss. If they're negative we may recheck in a couple of weeks depending on if she starts her menstrual cycle or not. If she does start her menstrual cycle but continues to have the abdominal bloating then we will work that up further at that time. She is to call the office if she has any questions, concerns or acute issues prior to her follow-up. - hCG, serum, qualitative  2. Abdominal  bloating See above medical treatment plan.       Margaretann LovelessJennifer M Burnette, PA-C  [facility]

## 2015-07-01 NOTE — Addendum Note (Signed)
Addended by: Margaretann LovelessBURNETTE, Nellie Chevalier M on: 07/01/2015 10:18 AM   Modules accepted: Orders

## 2015-07-02 LAB — HCG, SERUM, QUALITATIVE: hCG,Beta Subunit,Qual,Serum: POSITIVE m[IU]/mL — AB (ref <6)

## 2015-07-05 ENCOUNTER — Encounter: Payer: Self-pay | Admitting: Physician Assistant

## 2015-07-05 ENCOUNTER — Ambulatory Visit (INDEPENDENT_AMBULATORY_CARE_PROVIDER_SITE_OTHER): Payer: BC Managed Care – PPO | Admitting: Physician Assistant

## 2015-07-05 VITALS — BP 102/60 | HR 96 | Temp 98.2°F | Resp 16 | Wt 127.6 lb

## 2015-07-05 DIAGNOSIS — Z640 Problems related to unwanted pregnancy: Secondary | ICD-10-CM

## 2015-07-05 DIAGNOSIS — E349 Endocrine disorder, unspecified: Secondary | ICD-10-CM

## 2015-07-05 LAB — SPECIMEN STATUS REPORT

## 2015-07-05 LAB — BETA HCG QUANT (REF LAB): HCG QUANT: 2597 m[IU]/mL

## 2015-07-05 NOTE — Progress Notes (Signed)
Patient: Erin Vance Female    DOB: 02/15/1981   34 y.o.   MRN: [id number] Visit Date: 07/05/2015  Today's Provider: Margaretann LovelessJennifer M Burnette, PA-C   Chief Complaint  Patient presents with  . Follow-up    Labs   Subjective:    HPI Erin Vance is a 34 year old  Female here to follow-up on her lab results. She was seen last week with concerns of possible pregnancy from an unprotected sexual encounter. We did get blood work to test for pregnancy due to the early timing from missed menstrual cycle. The labs did come back positive with the hCG 2597. She discussed this over with her partner and due to her emotional status at this time they do feel that it would be best to proceed with an abortion.      Allergies  Allergen Reactions  . Cephalexin    Previous Medications   ALPRAZOLAM (XANAX) 0.25 MG TABLET    Take 1-2 tablets as needed every 4 hours for anxiety    Review of Systems  Constitutional: Negative.   Respiratory: Negative.   Cardiovascular: Negative.   Gastrointestinal: Negative.   Musculoskeletal: Negative.   Neurological: Negative.   Psychiatric/Behavioral: The patient is nervous/anxious.   All other systems reviewed and are negative.   Social History  Substance Use Topics  . Smoking status: Never Smoker   . Smokeless tobacco: Not on file  . Alcohol Use: 0.0 oz/week    0 Standard drinks or equivalent per week     Comment: occasionally   Objective:   BP 102/60 mmHg  Pulse 96  Temp(Src) 98.2 F (36.8 C) (Oral)  Resp 16  Wt 127 lb 9.6 oz (57.879 kg)  LMP 05/31/2015  Physical Exam  Constitutional: She appears well-developed and well-nourished. No distress.  Cardiovascular: Normal rate, regular rhythm and normal heart sounds.  Exam reveals no gallop and no friction rub.   No murmur heard. Pulmonary/Chest: Effort normal and breath sounds normal. No respiratory distress. She has no wheezes. She has no rales.  Abdominal: Soft. Bowel sounds are normal.  She exhibits no distension and no mass. There is no tenderness. There is no rebound and no guarding.  Skin: She is not diaphoretic.  Psychiatric: She has a normal mood and affect. Her behavior is normal. Judgment and thought content normal.  Vitals reviewed.       Assessment & Plan:     1. Elevated serum hCG We did discuss in detail the lab results and how she felt about the pregnancy. I did answer all her questions and discussed all her options in detail. At this time she would still like to proceed with abortion. She and her partner discussed this in detail and feel that due to her emotional state this would be the best option. I did give her clinical information [facility] [place] so that she may follow through with this. She is to call the office if she has any changes in plans, questions or concerns. I will see her back following [facility] and we will recheck her hCG to make sure that the abortion was successful if she chooses to proceed this way.   I did spend greater than 30 minutes with greater than 50% of this time spent counseling the patient on all her options.  2. Unwanted pregnancy with plans for termination See above medical treatment plan.       Margaretann LovelessJennifer M Burnette,  PA-C  [facility]

## 2015-07-26 ENCOUNTER — Encounter: Payer: Self-pay | Admitting: Physician Assistant

## 2015-07-26 ENCOUNTER — Ambulatory Visit (INDEPENDENT_AMBULATORY_CARE_PROVIDER_SITE_OTHER): Payer: BC Managed Care – PPO | Admitting: Physician Assistant

## 2015-07-26 VITALS — BP 110/80 | HR 77 | Temp 97.8°F | Resp 16 | Wt 133.2 lb

## 2015-07-26 DIAGNOSIS — R05 Cough: Secondary | ICD-10-CM

## 2015-07-26 DIAGNOSIS — R059 Cough, unspecified: Secondary | ICD-10-CM

## 2015-07-26 DIAGNOSIS — J069 Acute upper respiratory infection, unspecified: Secondary | ICD-10-CM

## 2015-07-26 MED ORDER — AZITHROMYCIN 250 MG PO TABS: ORAL_TABLET | ORAL | Status: DC

## 2015-07-26 MED ORDER — HYDROCODONE-HOMATROPINE 5-1.5 MG/5ML PO SYRP: 5.0000 mL | ORAL_SOLUTION | ORAL | Status: DC | PRN

## 2015-07-26 NOTE — Progress Notes (Signed)
Patient: Erin Vance Female    DOB: Jan 14, 1981   34 y.o.   MRN: [id number] Visit Date: 07/26/2015  Today's Provider: Margaretann Loveless, PA-C   Chief Complaint  Patient presents with  . URI   Subjective:    URI  This is a new problem. The current episode started in the past 7 days. The problem has been gradually worsening. There has been no fever. Associated symptoms include congestion, coughing, a plugged ear sensation, sneezing and a sore throat. Pertinent negatives include no abdominal pain, chest pain, nausea, rhinorrhea, vomiting or wheezing. Treatments tried: Claritin, Delsym. The treatment provided no relief.   She states that she had symptoms for approximately 3 days and started getting better this past Sunday but then Sunday night noticed that she was feeling even worse and she had before. She does not report any fevers but does state she was having some chills and body aches Sunday night. She also states that her throat started to bother her more Sunday night and Monday.  We also did discuss how she was doing emotionally after the abortion that she had last week. She was able to go to [facility] for this and states she has been doing okay since then. She did have her follow-up on Monday and states that everything was okay and they did start her on birth control at that time. They did do an ultrasound to make sure that she had clearance of the embryonic material, which she did. She will go back next week for her last follow-up.    Allergies  Allergen Reactions  . Cephalexin    Previous Medications   ALPRAZOLAM (XANAX) 0.25 MG TABLET    Take 1-2 tablets as needed every 4 hours for anxiety   NORGESTIMATE-ETHINYL ESTRADIOL (ORTHO-CYCLEN,SPRINTEC,PREVIFEM) 0.25-35 MG-MCG TABLET    Take by mouth.    Review of Systems  Constitutional: Negative.   HENT: Positive for congestion, postnasal drip, sneezing, sore throat and voice change. Negative for rhinorrhea.   Eyes:  Negative.   Respiratory: Positive for cough. Negative for chest tightness, shortness of breath and wheezing.   Cardiovascular: Negative for chest pain, palpitations and leg swelling.  Gastrointestinal: Negative for nausea, vomiting and abdominal pain.  All other systems reviewed and are negative.   Social History  Substance Use Topics  . Smoking status: Never Smoker   . Smokeless tobacco: Not on file  . Alcohol Use: 0.0 oz/week    0 Standard drinks or equivalent per week     Comment: occasionally   Objective:   BP 110/80 mmHg  Pulse 77  Temp(Src) 97.8 F (36.6 C) (Oral)  Resp 16  Wt 133 lb 3.2 oz (60.419 kg)  SpO2 98%  LMP 05/31/2015  Physical Exam  Constitutional: She appears well-developed and well-nourished. No distress.  HENT:  Head: Normocephalic and atraumatic.  Right Ear: Hearing, external ear and ear canal normal. Tympanic membrane is not erythematous and not bulging. A middle ear effusion is present.  Left Ear: Hearing, external ear and ear canal normal. Tympanic membrane is not erythematous and not bulging. A middle ear effusion is present.  Nose: Mucosal edema and rhinorrhea present. Right sinus exhibits no maxillary sinus tenderness and no frontal sinus tenderness. Left sinus exhibits no maxillary sinus tenderness and no frontal sinus tenderness.  Mouth/Throat: Uvula is midline and mucous membranes are normal. Posterior oropharyngeal erythema present. No oropharyngeal exudate or posterior oropharyngeal edema.  Eyes: Conjunctivae are normal. Pupils are equal, round,  and reactive to light. Right eye exhibits no discharge. Left eye exhibits no discharge. No scleral icterus.  Neck: Normal range of motion. Neck supple. No tracheal deviation present. No thyromegaly present.  Cardiovascular: Normal rate, regular rhythm and normal heart sounds.  Exam reveals no gallop and no friction rub.   No murmur heard. Pulmonary/Chest: Effort normal and breath sounds normal. No stridor.  No respiratory distress. She has no wheezes. She has no rales.  Lymphadenopathy:    She has no cervical adenopathy.  Skin: Skin is warm and dry. She is not diaphoretic.  Vitals reviewed.       Assessment & Plan:     1. Cough Worsening nighttime cough. I will give Hycodan cough syrup as below for her to take at night as needed for cough. I did advise her that she may also take this during the day if she knows that she is going to be at home and not have to drive anywhere as the medication will make her drowsy. She voiced understanding. She is to call the office if symptoms failed to improve or worsen. - HYDROcodone-homatropine (HYDROMET) 5-1.5 MG/5ML syrup; Take 5 mLs by mouth every 4 (four) hours as needed for cough.  Dispense: 180 mL; Refill: 0  2. Upper respiratory infection Worsening. She has not responded to over-the-counter treatments as well as had at time of eating better than getting worse again. I will treat with a Z-Pak as below. I did advise her that she may continue with Mucinex DM for the congestion as well as daytime cough. I did advise her to make sure to stay well-hydrated as well as to get some rest because her body has been through a lot over the last 2 weeks. She voiced understanding. She is to call the office if symptoms fail to improve or worsen. - azithromycin (ZITHROMAX) 250 MG tablet; Two tablets day one, then one tablet daily next 4 days.  Dispense: 6 tablet; Refill: 0       Margaretann LovelessJennifer M Gautham Hewins, PA-C  [facility]

## 2015-07-26 NOTE — Patient Instructions (Signed)
Upper Respiratory Infection, Adult Most upper respiratory infections (URIs) are a viral infection of the air passages leading to the lungs. A URI affects the nose, throat, and upper air passages. The most common type of URI is nasopharyngitis and is typically referred to as "the common cold." URIs run their course and usually go away on their own. Most of the time, a URI does not require medical attention, but sometimes a bacterial infection in the upper airways can follow a viral infection. This is called a secondary infection. Sinus and middle ear infections are common types of secondary upper respiratory infections. Bacterial pneumonia can also complicate a URI. A URI can worsen asthma and chronic obstructive pulmonary disease (COPD). Sometimes, these complications can require emergency medical care and may be life threatening.  CAUSES Almost all URIs are caused by viruses. A virus is a type of germ and can spread from one person to another.  RISKS FACTORS You may be at risk for a URI if:   You smoke.   You have chronic heart or lung disease.  You have a weakened defense (immune) system.   You are very young or very old.   You have nasal allergies or asthma.  You work in crowded or poorly ventilated areas.  You work in health care facilities or schools. SIGNS AND SYMPTOMS  Symptoms typically develop 2-3 days after you come in contact with a cold virus. Most viral URIs last 7-10 days. However, viral URIs from the influenza virus (flu virus) can last 14-18 days and are typically more severe. Symptoms may include:   Runny or stuffy (congested) nose.   Sneezing.   Cough.   Sore throat.   Headache.   Fatigue.   Fever.   Loss of appetite.   Pain in your forehead, behind your eyes, and over your cheekbones (sinus pain).  Muscle aches.  DIAGNOSIS  Your health care provider may diagnose a URI by:  Physical exam.  Tests to check that your symptoms are not due to  another condition such as:  Strep throat.  Sinusitis.  Pneumonia.  Asthma. TREATMENT  A URI goes away on its own with time. It cannot be cured with medicines, but medicines may be prescribed or recommended to relieve symptoms. Medicines may help:  Reduce your fever.  Reduce your cough.  Relieve nasal congestion. HOME CARE INSTRUCTIONS   Take medicines only as directed by your health care provider.   Gargle warm saltwater or take cough drops to comfort your throat as directed by your health care provider.  Use a warm mist humidifier or inhale steam from a shower to increase air moisture. This may make it easier to breathe.  Drink enough fluid to keep your urine clear or pale yellow.   Eat soups and other clear broths and maintain good nutrition.   Rest as needed.   Return to work when your temperature has returned to normal or as your health care provider advises. You may need to stay home longer to avoid infecting others. You can also use a face mask and careful hand washing to prevent spread of the virus.  Increase the usage of your inhaler if you have asthma.   Do not use any tobacco products, including cigarettes, chewing tobacco, or electronic cigarettes. If you need help quitting, ask your health care provider. PREVENTION  The best way to protect yourself from getting a cold is to practice good hygiene.   Avoid oral or hand contact with people with cold   symptoms.   Wash your hands often if contact occurs.  There is no clear evidence that vitamin C, vitamin E, echinacea, or exercise reduces the chance of developing a cold. However, it is always recommended to get plenty of rest, exercise, and practice good nutrition.  SEEK MEDICAL CARE IF:   You are getting worse rather than better.   Your symptoms are not controlled by medicine.   You have chills.  You have worsening shortness of breath.  You have brown or red mucus.  You have yellow or brown nasal  discharge.  You have pain in your face, especially when you bend forward.  You have a fever.  You have swollen neck glands.  You have pain while swallowing.  You have white areas in the back of your throat. SEEK IMMEDIATE MEDICAL CARE IF:   You have severe or persistent:  Headache.  Ear pain.  Sinus pain.  Chest pain.  You have chronic lung disease and any of the following:  Wheezing.  Prolonged cough.  Coughing up blood.  A change in your usual mucus.  You have a stiff neck.  You have changes in your:  Vision.  Hearing.  Thinking.  Mood. MAKE SURE YOU:   Understand these instructions.  Will watch your condition.  Will get help right away if you are not doing well or get worse.   This information is not intended to replace advice given to you by your health care provider. Make sure you discuss any questions you have with your health care provider.   Document Released: 01/16/2001 Document Revised: 12/07/2014 Document Reviewed: 10/28/2013 Elsevier Interactive Patient Education 2016 Elsevier Inc.  

## 2015-09-22 ENCOUNTER — Encounter: Payer: Self-pay | Admitting: Physician Assistant

## 2015-09-22 ENCOUNTER — Ambulatory Visit (INDEPENDENT_AMBULATORY_CARE_PROVIDER_SITE_OTHER): Payer: BC Managed Care – PPO | Admitting: Physician Assistant

## 2015-09-22 VITALS — BP 120/70 | HR 72 | Temp 98.4°F | Resp 16 | Wt 136.4 lb

## 2015-09-22 DIAGNOSIS — F4323 Adjustment disorder with mixed anxiety and depressed mood: Secondary | ICD-10-CM

## 2015-09-22 MED ORDER — ALPRAZOLAM 0.25 MG PO TABS: ORAL_TABLET | ORAL | Status: DC

## 2015-09-22 NOTE — Patient Instructions (Signed)
Complicated Grieving Grief is a normal response to the death of someone close to you. Feelings of fear, anger, and guilt can affect almost everyone who loses a loved one. It is also common to have symptoms of depression while you are grieving. These include problems with sleep, loss of appetite, and lack of energy. They may last for weeks or months after a loss. Complicated grief is different from normal grief or depression. Normal grieving involves sadness and feelings of loss, but these feelings are not constant. Complicated grief is a constant and severe type of grief. It interferes with your ability to function normally. It may last for several months to a year or longer. Complicated grief may require treatment from a mental health care provider. CAUSES  It is not known why some people continue to struggle with grief and others do not. You may be at higher risk for complicated grief if:  The death of your loved one was sudden or unexpected.  The death of your loved one was due to a violent event.  Your loved one committed suicide.  Your loved one was a child or a young person.  You were very close to or dependent on the loved one.  You have a history of depression. SIGNS AND SYMPTOMS Signs and symptoms of complicated grief may include:  Feeling disbelief or numbness.  Being unable to enjoy good memories of your loved one.  Needing to avoid anything that reminds you of your loved one.  Being unable to stop thinking about the death.  Feeling intense anger or guilt.  Feeling alone and hopeless.  Feeling that your life is meaningless and empty.  Losing the desire to live. DIAGNOSIS Your health care provider may diagnose complicated grief if:  You have constant symptoms of grief for 6-12 months or longer.  Your symptoms are interfering with your ability to live your life. Your health care provider may want you to see a mental health care provider. Many symptoms of depression  are similar to the symptoms of complicated grief. It is important to be evaluated for complicated grief along with other mental health conditions. TREATMENT  Talk therapy with a mental health provider is the most common treatment for complicated grief. During therapy, you will learn healthy ways to cope with the loss of your loved one. In some cases, your mental health care provider may also recommend antidepressant medicines. HOME CARE INSTRUCTIONS  Take care of yourself.  Eat regular meals and maintain a healthy diet. Eat plenty of fruits, vegetables, and whole grains.  Try to get some exercise each day.  Keep regular hours for sleep. Try to get at least 8 hours of sleep each night.  Do not use drugs or alcohol to ease your symptoms.  Take medicines only as directed by your health care provider.  Spend time with friends and loved ones.  Consider joining a grief (bereavement) support group to help you deal with your loss.  Keep all follow-up visits as directed by your health care provider. This is important. SEEK MEDICAL CARE IF:  Your symptoms keep you from functioning normally.  Your symptoms do not get better with treatment. SEEK IMMEDIATE MEDICAL CARE IF:  You have serious thoughts of hurting yourself or someone else.  You have suicidal feelings.   This information is not intended to replace advice given to you by your health care provider. Make sure you discuss any questions you have with your health care provider.   Document Released: 07/23/2005   Document Revised: 04/13/2015 Document Reviewed: 12/31/2013 Elsevier Interactive Patient Education 2016 Elsevier Inc.  

## 2015-09-22 NOTE — Progress Notes (Signed)
       Patient: Erin Vance Female    DOB: Apr 28, 1981   35 y.o.   MRN: [id number] Visit Date: 09/22/2015  Today's Provider: Margaretann Loveless, PA-C   Chief Complaint  Patient presents with  . Follow-up    Anxiety   Subjective:    HPI  Erin Vance is here today to follow-up on her anxiety mixed with depression. Patient has not been to the counselor due to financial reasons at this time. She takes the Xanax as needed, not daily. She feels good today, and feels more energetic. She has started to walk and jog. She sometimes feels sad and tearful because she misses her husband.  She states she has been having more good days than bad days however. She does have a strong support system of friends and family that she is able to talk to about how she feels and this helps her.       Allergies  Allergen Reactions  . Cephalexin    Previous Medications   ALPRAZOLAM (XANAX) 0.25 MG TABLET    Take 1-2 tablets as needed every 4 hours for anxiety   AZITHROMYCIN (ZITHROMAX) 250 MG TABLET    Two tablets day one, then one tablet daily next 4 days.   HYDROCODONE-HOMATROPINE (HYDROMET) 5-1.5 MG/5ML SYRUP    Take 5 mLs by mouth every 4 (four) hours as needed for cough.   NORGESTIMATE-ETHINYL ESTRADIOL (ORTHO-CYCLEN,SPRINTEC,PREVIFEM) 0.25-35 MG-MCG TABLET    Take by mouth.    Review of Systems  Constitutional: Negative for activity change and fatigue.  Respiratory: Negative.   Cardiovascular: Negative.   Gastrointestinal: Negative.   Psychiatric/Behavioral: Positive for decreased concentration. The patient is nervous/anxious (sometimes).     Social History  Substance Use Topics  . Smoking status: Never Smoker   . Smokeless tobacco: Not on file  . Alcohol Use: 0.0 oz/week    0 Standard drinks or equivalent per week     Comment: occasionally   Objective:   BP 120/70 mmHg  Pulse 72  Temp(Src) 98.4 F (36.9 C) (Oral)  Resp 16  Wt 136 lb 6.4 oz (61.871 kg)  LMP  09/15/2015  Physical Exam  Constitutional: She appears well-developed and well-nourished. No distress.  Cardiovascular: Normal rate, regular rhythm and normal heart sounds.  Exam reveals no gallop and no friction rub.   No murmur heard. Pulmonary/Chest: Effort normal and breath sounds normal. No respiratory distress. She has no wheezes. She has no rales.  Skin: She is not diaphoretic.  Psychiatric: Her speech is normal and behavior is normal. Judgment and thought content normal. Cognition and memory are normal. She exhibits a depressed mood.  Tearful at times when discussing her late husband  Vitals reviewed.       Assessment & Plan:     1. Situational mixed anxiety and depressive disorder Currently stable. Will continue current medical treatment plan. She is to call the office if symptoms worsen. I will see her back in 3 months to reevaluate how she is doing. This will also be right before testing and coming up on the one year anniversary so we will discuss these issues and coping mechanisms at that time as well. - ALPRAZolam (XANAX) 0.25 MG tablet; Take 1-2 tablets as needed every 4 hours for anxiety  Dispense: 30 tablet; Refill: 1       Margaretann Loveless, PA-C  [facility]

## 2015-12-20 ENCOUNTER — Ambulatory Visit (INDEPENDENT_AMBULATORY_CARE_PROVIDER_SITE_OTHER): Payer: BC Managed Care – PPO | Admitting: Physician Assistant

## 2015-12-20 ENCOUNTER — Encounter: Payer: Self-pay | Admitting: Physician Assistant

## 2015-12-20 VITALS — BP 108/68 | HR 80 | Temp 98.2°F | Resp 16 | Wt 145.2 lb

## 2015-12-20 DIAGNOSIS — R1310 Dysphagia, unspecified: Secondary | ICD-10-CM | POA: Diagnosis not present

## 2015-12-20 DIAGNOSIS — F4323 Adjustment disorder with mixed anxiety and depressed mood: Secondary | ICD-10-CM

## 2015-12-20 MED ORDER — ALPRAZOLAM 0.25 MG PO TABS: ORAL_TABLET | ORAL | Status: DC

## 2015-12-20 NOTE — Patient Instructions (Signed)
Complicated Grieving Grief is a normal response to the death of someone close to you. Feelings of fear, anger, and guilt can affect almost everyone who loses a loved one. It is also common to have symptoms of depression while you are grieving. These include problems with sleep, loss of appetite, and lack of energy. They may last for weeks or months after a loss. Complicated grief is different from normal grief or depression. Normal grieving involves sadness and feelings of loss, but these feelings are not constant. Complicated grief is a constant and severe type of grief. It interferes with your ability to function normally. It may last for several months to a year or longer. Complicated grief may require treatment from a mental health care provider. CAUSES  It is not known why some people continue to struggle with grief and others do not. You may be at higher risk for complicated grief if:  The death of your loved one was sudden or unexpected.  The death of your loved one was due to a violent event.  Your loved one committed suicide.  Your loved one was a child or a young person.  You were very close to or dependent on the loved one.  You have a history of depression. SIGNS AND SYMPTOMS Signs and symptoms of complicated grief may include:  Feeling disbelief or numbness.  Being unable to enjoy good memories of your loved one.  Needing to avoid anything that reminds you of your loved one.  Being unable to stop thinking about the death.  Feeling intense anger or guilt.  Feeling alone and hopeless.  Feeling that your life is meaningless and empty.  Losing the desire to live. DIAGNOSIS Your health care provider may diagnose complicated grief if:  You have constant symptoms of grief for 6-12 months or longer.  Your symptoms are interfering with your ability to live your life. Your health care provider may want you to see a mental health care provider. Many symptoms of depression  are similar to the symptoms of complicated grief. It is important to be evaluated for complicated grief along with other mental health conditions. TREATMENT  Talk therapy with a mental health provider is the most common treatment for complicated grief. During therapy, you will learn healthy ways to cope with the loss of your loved one. In some cases, your mental health care provider may also recommend antidepressant medicines. HOME CARE INSTRUCTIONS  Take care of yourself.  Eat regular meals and maintain a healthy diet. Eat plenty of fruits, vegetables, and whole grains.  Try to get some exercise each day.  Keep regular hours for sleep. Try to get at least 8 hours of sleep each night.  Do not use drugs or alcohol to ease your symptoms.  Take medicines only as directed by your health care provider.  Spend time with friends and loved ones.  Consider joining a grief (bereavement) support group to help you deal with your loss.  Keep all follow-up visits as directed by your health care provider. This is important. SEEK MEDICAL CARE IF:  Your symptoms keep you from functioning normally.  Your symptoms do not get better with treatment. SEEK IMMEDIATE MEDICAL CARE IF:  You have serious thoughts of hurting yourself or someone else.  You have suicidal feelings.   This information is not intended to replace advice given to you by your health care provider. Make sure you discuss any questions you have with your health care provider.   Document Released: 07/23/2005   Document Revised: 04/13/2015 Document Reviewed: 12/31/2013 Elsevier Interactive Patient Education 2016 Elsevier Inc.  

## 2015-12-20 NOTE — Progress Notes (Signed)
Patient: Erin Vance Female    DOB: 03/14/1981   35 y.o.   MRN: [id number] Visit Date: 12/20/2015  Today's Provider: Margaretann LovelessJennifer M Abhay Godbolt, PA-C   Chief Complaint  Patient presents with  . Follow-up    Situational mixed anxiety and depressive disorder   Subjective:    HPI  Erin Vance Scarlet is here to follow-up on situational mixed anxiety and depressive disorder secondary to grief associated with the suicidal death of her husband last June 2016. Last office visit patient was stable and doing fairly well. She is here to be reevaluated since is close to the anniversary. She reports that is getting harder. 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 She feels if she had not been doing that she may have noticed signs. It is also very stressful for her to be doing that position as well and has had a lot of hiccups this year with doing it that has caused her stress and anxiety. She feels that is difficult to focus. She has been crying since the last month, more irritable. No suicidal ideas. She is trying her best to cope but is hard. She states she is wanting the days to just stop because she feels it getting closer. She feels she has been more upset and tearful since April after doing their taxes. She is now seeing the counselor again but only once per month. She is still in a happy, healthy relationship with Erin Vance and feels that is the one positive in her life. She does report a slight increase in the xanax, but still states she has never taken more than 2 in one day.   She also has a new complaint of having some difficulty swallowing foods. It has happened 3 times since December with most recently in April. There have been no correlations of types of food. She states that when it has happened she has not even noticed initially that the food felt stuck until she  took a sip of liquid. At that time she would notice the food and liquid not moving past the area of esophagus around the collar bone level. She states it is very scary and happened once in public and everyone was terrified. She normally has to make herself sick to get it out. She denies it being around times of stress, increased anxiety, or heightened emotional state. She has never had anything like this before and denies any family history of anyone having similar issues or having to have their esophagus stretched.    Allergies  Allergen Reactions  . Cephalexin    Previous Medications   ALPRAZOLAM (XANAX) 0.25 MG TABLET    Take 1-2 tablets as needed every 4 hours for anxiety   NORGESTIMATE-ETHINYL ESTRADIOL (ORTHO-CYCLEN,SPRINTEC,PREVIFEM) 0.25-35 MG-MCG TABLET    Take by mouth.    Review of Systems  Constitutional: Positive for appetite change (Eating less) and fatigue.  Respiratory: Positive for choking. Negative for cough, chest tightness, shortness of breath and wheezing.   Cardiovascular: Negative for chest pain, palpitations and leg swelling.  Gastrointestinal: Negative for nausea, vomiting and abdominal pain.  Psychiatric/Behavioral: Positive for dysphoric mood and decreased concentration. Negative for suicidal ideas, sleep disturbance, self-injury and agitation. The patient is nervous/anxious.     Social History  Substance Use Topics  . Smoking status: Never  Smoker   . Smokeless tobacco: Not on file  . Alcohol Use: 0.0 oz/week    0 Standard drinks or equivalent per week     Comment: occasionally   Objective:   BP 108/68 mmHg  Pulse 80  Temp(Src) 98.2 F (36.8 C) (Oral)  Resp 16  Wt 145 lb 3.2 oz (65.862 kg)  LMP 12/09/2015  Physical Exam  Constitutional: She appears well-developed and well-nourished. No distress.  Neck: Normal range of motion. Neck supple. No tracheal deviation present. No thyromegaly present.  Cardiovascular: Normal rate, regular rhythm and normal  heart sounds.  Exam reveals no gallop and no friction rub.   No murmur heard. Pulmonary/Chest: Effort normal and breath sounds normal. No respiratory distress. She has no wheezes. She has no rales.  Lymphadenopathy:    She has no cervical adenopathy.  Skin: She is not diaphoretic.  Psychiatric: Her behavior is normal. Judgment and thought content normal. She exhibits a depressed mood.  More tearful  Vitals reviewed.       Assessment & Plan:     1. Situational mixed anxiety and depressive disorder Discussed adding another agent but do to adverse effects the last time we tried she does not wish to add at this time. Discussed different coping mechanisms and advised for her to try not to take on too much at this time to overwhelm her and make symptoms worse. She agrees. Will continue xanax as needed. I will see her back in 4 weeks as this will be one week after the anniversary to see how she is doing and to discuss the swallowing issues and work that up at that time.  - ALPRAZolam (XANAX) 0.25 MG tablet; Take 1-2 tablets as needed every 4 hours for anxiety  Dispense: 30 tablet; Refill: 1  2. Difficulty swallowing Wants to await until after the anniversary of her husband's death in 3 weeks before "adding more to her plate of things to worry about." I will see her back in 4 weeks to discuss. She is to call if it becomes more frequent or worsens in the meantime.       Margaretann Loveless, PA-C  [facility]

## 2016-01-19 ENCOUNTER — Encounter: Payer: Self-pay | Admitting: Physician Assistant

## 2016-01-19 ENCOUNTER — Ambulatory Visit (INDEPENDENT_AMBULATORY_CARE_PROVIDER_SITE_OTHER): Payer: BC Managed Care – PPO | Admitting: Physician Assistant

## 2016-01-19 VITALS — BP 120/80 | HR 75 | Temp 98.1°F | Resp 16 | Wt 143.4 lb

## 2016-01-19 DIAGNOSIS — F4323 Adjustment disorder with mixed anxiety and depressed mood: Secondary | ICD-10-CM | POA: Diagnosis not present

## 2016-01-19 NOTE — Progress Notes (Signed)
Patient: Erin Vance Female    DOB: 01/22/1981   35 y.o.   MRN: [id number] Visit Date: 01/19/2016  Today's Provider: Margaretann LovelessJennifer M Burnette, PA-C   Chief Complaint  Patient presents with  . Follow-up    Situational mixed anxiety and depressive disorder ; Difficulty swallowing   Subjective:    HPI Erin Vance is following up her situational anxiety and grief disorder. Today was a f/u to see how she was doing. Last week marked the one year anniversary of Erin Vance's (her husbands) passing. She reports it has been really hard. She had some tough days. She reports that on a Wednesday in the middle of testing she got really angry and upset with herself. She was stressed and had made a few mistakes with the testing. She gets frustrated at herself for making simple mistakes, nothing major. Someone said something jokingly to her about it and she got upset. She walked away from the situation and cried in the hallway. She went and took a xanax and was able to calm down and go back to the testing.    She said this past year was overwhelming. 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 She reports that she took her Xanax more often during testing.   Overall she feels she has done fairly well. She did see her counselor two days before the anniversary and that helped as well. She also had things that kept her busy. They set up 2 memorial disc golf tournaments in [place] memory and had a celebration ceremony of life. She states this  helped her as well.  Difficulty swallowing: She reports that she has not choke anymore. Sometimes it just feels tight when she gets anxious.    Allergies  Allergen Reactions  . Cephalexin    Current Meds  Medication Sig  . ALPRAZolam (XANAX) 0.25 MG tablet Take 1-2 tablets as needed every 4 hours for anxiety  . norgestimate-ethinyl estradiol (ORTHO-CYCLEN,SPRINTEC,PREVIFEM) 0.25-35 MG-MCG tablet Take by mouth.    Review of Systems  Constitutional: Negative.   Respiratory: Negative.   Cardiovascular: Negative for chest pain.  Gastrointestinal: Negative.   Psychiatric/Behavioral: Negative for dysphoric mood, decreased concentration and agitation. The patient is nervous/anxious.     Social History  Substance Use Topics  . Smoking status: Never Smoker   . Smokeless tobacco: Not on file  . Alcohol Use: 0.0 oz/week    0 Standard drinks or equivalent per week     Comment: occasionally   Objective:   BP 120/80 mmHg  Pulse 75  Temp(Src) 98.1 F (36.7 C) (Oral)  Resp 16  Wt 143 lb 6.4 oz (65.046 kg)  LMP 12/09/2015  Physical Exam  Constitutional: She appears well-developed and well-nourished. No distress.  Cardiovascular: Normal rate, regular rhythm and normal heart sounds.  Exam reveals no gallop and no friction rub.   No murmur heard. Pulmonary/Chest: Effort normal and breath sounds normal. No respiratory distress. She has no wheezes. She has no rales.  Skin:  She is not diaphoretic.  Psychiatric: She has a normal mood and affect. Her behavior is normal. Judgment and thought content normal.  Vitals reviewed.      Assessment & Plan:     1. Situational mixed anxiety and depressive disorder She is doing well under the circumstances. Will continue current plan. I will see her back in 3 months. She is to call if symptoms worsen, she develops any acute issue, or has any questions or concerns.       Margaretann Loveless, PA-C  [facility]

## 2016-04-20 ENCOUNTER — Encounter: Payer: Self-pay | Admitting: Physician Assistant

## 2016-04-20 ENCOUNTER — Ambulatory Visit (INDEPENDENT_AMBULATORY_CARE_PROVIDER_SITE_OTHER): Payer: BC Managed Care – PPO | Admitting: Physician Assistant

## 2016-04-20 VITALS — BP 110/70 | HR 81 | Temp 97.7°F | Resp 16 | Wt 147.8 lb

## 2016-04-20 DIAGNOSIS — Z713 Dietary counseling and surveillance: Secondary | ICD-10-CM

## 2016-04-20 DIAGNOSIS — Z23 Encounter for immunization: Secondary | ICD-10-CM

## 2016-04-20 DIAGNOSIS — F4323 Adjustment disorder with mixed anxiety and depressed mood: Secondary | ICD-10-CM | POA: Diagnosis not present

## 2016-04-20 MED ORDER — ALPRAZOLAM 0.25 MG PO TABS: ORAL_TABLET | ORAL | 1 refills | Status: DC

## 2016-04-20 NOTE — Patient Instructions (Signed)
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
## 2016-07-20 ENCOUNTER — Encounter: Payer: Self-pay | Admitting: Physician Assistant

## 2016-07-20 ENCOUNTER — Ambulatory Visit (INDEPENDENT_AMBULATORY_CARE_PROVIDER_SITE_OTHER): Payer: BC Managed Care – PPO | Admitting: Physician Assistant

## 2016-07-20 DIAGNOSIS — F4323 Adjustment disorder with mixed anxiety and depressed mood: Secondary | ICD-10-CM

## 2016-07-20 MED ORDER — ALPRAZOLAM 0.25 MG PO TABS: ORAL_TABLET | ORAL | 1 refills | Status: DC

## 2016-07-20 NOTE — Patient Instructions (Signed)

## 2016-07-20 NOTE — Progress Notes (Signed)
       Patient: Erin Vance Female    DOB: 07/29/1981   35 y.o.   MRN: [id number] Visit Date: 07/20/2016  Today's Provider: Margaretann LovelessJennifer M Zyliah Schier, PA-C   Chief Complaint  Patient presents with  . Follow-up    weight and Grief   Subjective:    HPI Patient is here for 3 months follow-up weight and grief. She still has the situational mixed anxiety and depression that she struggles some days but over all better.  She reports that she started exercising 3 weeks with some people from school. They work ou t 4 days a week. She is eating more fruits cut down on sodas and bread.    Allergies  Allergen Reactions  . Cephalexin      Current Outpatient Prescriptions:  .  ALPRAZolam (XANAX) 0.25 MG tablet, Take 1-2 tablets as needed every 4 hours for anxiety, Disp: 30 tablet, Rfl: 1 .  norgestimate-ethinyl estradiol (ORTHO-CYCLEN,SPRINTEC,PREVIFEM) 0.25-35 MG-MCG tablet, Take by mouth., Disp: , Rfl:   Review of Systems  Constitutional: Negative.   Respiratory: Negative.   Cardiovascular: Negative.   Gastrointestinal: Negative.   Neurological: Negative for dizziness and headaches.  Psychiatric/Behavioral: Positive for sleep disturbance. Negative for agitation, decreased concentration and dysphoric mood. The patient is nervous/anxious (sometimes).     Social History  Substance Use Topics  . Smoking status: Never Smoker  . Smokeless tobacco: Never Used  . Alcohol use 0.0 oz/week     Comment: occasionally   Objective:   BP 100/60 (BP Location: Right Arm, Patient Position: Sitting, Cuff Size: Normal)   Pulse 73   Temp 97.8 F (36.6 C) (Oral)   Resp 16   Wt 151 lb 12.8 oz (68.9 kg)   BMI 23.08 kg/m   Physical Exam  Constitutional: She appears well-developed and well-nourished. No distress.  Neck: Normal range of motion. Neck supple.  Cardiovascular: Normal rate, regular rhythm and normal heart sounds.  Exam reveals no gallop and no friction rub.   No murmur  heard. Pulmonary/Chest: Effort normal and breath sounds normal. No respiratory distress. She has no wheezes. She has no rales.  Skin: She is not diaphoretic.  Psychiatric: She has a normal mood and affect. Her behavior is normal. Judgment and thought content normal.  Vitals reviewed.     Assessment & Plan:     1. Situational mixed anxiety and depressive disorder She continues to have good and bad days. Overall the good days are starting to outweigh the bad days. She is also started working with an exercise group that is helping with her stress and anxiety. I will refill her Xanax as below. She is to call if she has any worsening symptoms in the meantime. I will see her back in 6 months. - ALPRAZolam (XANAX) 0.25 MG tablet; Take 1-2 tablets as needed every 4 hours for anxiety  Dispense: 30 tablet; Refill: 1     This office note has been dictated so please excuse any grammatical or typographical errors.  Erin LovelessJennifer M Tanya Crothers, PA-C  [facility]

## 2016-09-22 ENCOUNTER — Other Ambulatory Visit: Payer: Self-pay | Admitting: Physician Assistant

## 2016-09-22 DIAGNOSIS — F4323 Adjustment disorder with mixed anxiety and depressed mood: Secondary | ICD-10-CM

## 2016-09-24 NOTE — Telephone Encounter (Signed)
Called into [facility]

## 2017-06-26 ENCOUNTER — Other Ambulatory Visit: Payer: Self-pay

## 2017-06-26 ENCOUNTER — Ambulatory Visit (INDEPENDENT_AMBULATORY_CARE_PROVIDER_SITE_OTHER): Payer: BC Managed Care – PPO | Admitting: Physician Assistant

## 2017-06-26 ENCOUNTER — Encounter: Payer: Self-pay | Admitting: Physician Assistant

## 2017-06-26 VITALS — BP 110/68 | HR 69 | Temp 98.4°F | Resp 16 | Ht 68.0 in | Wt 154.0 lb

## 2017-06-26 DIAGNOSIS — Z114 Encounter for screening for human immunodeficiency virus [HIV]: Secondary | ICD-10-CM | POA: Diagnosis not present

## 2017-06-26 DIAGNOSIS — Z0000 Encounter for general adult medical examination without abnormal findings: Secondary | ICD-10-CM

## 2017-06-26 DIAGNOSIS — F4323 Adjustment disorder with mixed anxiety and depressed mood: Secondary | ICD-10-CM | POA: Diagnosis not present

## 2017-06-26 MED ORDER — ALPRAZOLAM 0.25 MG PO TABS: ORAL_TABLET | ORAL | 1 refills | Status: DC

## 2017-06-26 NOTE — Patient Instructions (Signed)
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
## 2017-06-27 LAB — COMPLETE METABOLIC PANEL WITH GFR
AG RATIO: 1.6 (calc) (ref 1.0–2.5)
ALKALINE PHOSPHATASE (APISO): 50 U/L (ref 33–115)
ALT: 17 U/L (ref 6–29)
AST: 19 U/L (ref 10–30)
Albumin: 4.4 g/dL (ref 3.6–5.1)
BILIRUBIN TOTAL: 0.7 mg/dL (ref 0.2–1.2)
BUN: 13 mg/dL (ref 7–25)
CALCIUM: 9.2 mg/dL (ref 8.6–10.2)
CHLORIDE: 103 mmol/L (ref 98–110)
CO2: 25 mmol/L (ref 20–32)
Creat: 0.83 mg/dL (ref 0.50–1.10)
GFR, Est African American: 105 mL/min/{1.73_m2} (ref >=60)
GFR, Est Non African American: 91 mL/min/{1.73_m2} (ref >=60)
GLOBULIN: 2.7 g/dL (ref 1.9–3.7)
Glucose, Bld: 81 mg/dL (ref 65–99)
POTASSIUM: 4.1 mmol/L (ref 3.5–5.3)
SODIUM: 137 mmol/L (ref 135–146)
Total Protein: 7.1 g/dL (ref 6.1–8.1)

## 2017-06-27 LAB — CBC WITH DIFFERENTIAL/PLATELET
BASOS ABS: 44 {cells}/uL (ref 0–200)
Basophils Relative: 0.5 %
EOS ABS: 479 {cells}/uL (ref 15–500)
Eosinophils Relative: 5.5 %
HCT: 41.8 % (ref 35.0–45.0)
Hemoglobin: 14.3 g/dL (ref 11.7–15.5)
Lymphs Abs: 1784 cells/uL (ref 850–3900)
MCH: 29.6 pg (ref 27.0–33.0)
MCHC: 34.2 g/dL (ref 32.0–36.0)
MCV: 86.5 fL (ref 80.0–100.0)
MPV: 10.8 fL (ref 7.5–12.5)
Monocytes Relative: 6.8 %
NEUTROS PCT: 66.7 %
Neutro Abs: 5803 cells/uL (ref 1500–7800)
PLATELETS: 273 10*3/uL (ref 140–400)
RBC: 4.83 10*6/uL (ref 3.80–5.10)
RDW: 11.9 % (ref 11.0–15.0)
Total Lymphocyte: 20.5 %
WBC: 8.7 10*3/uL (ref 3.8–10.8)
WBCMIX: 592 {cells}/uL (ref 200–950)

## 2017-06-27 LAB — HIV ANTIBODY (ROUTINE TESTING W REFLEX): HIV 1&2 Ab, 4th Generation: NONREACTIVE

## 2017-06-27 LAB — HEMOGLOBIN A1C
EAG (MMOL/L): 5.4 (calc)
HEMOGLOBIN A1C: 5 %{Hb} (ref <5.7)
Mean Plasma Glucose: 97 (calc)

## 2017-06-27 LAB — LIPID PANEL
Cholesterol: 166 mg/dL (ref <200)
HDL: 90 mg/dL (ref >50)
LDL Cholesterol (Calc): 62 mg/dL (calc)
NON-HDL CHOLESTEROL (CALC): 76 mg/dL (ref <130)
Total CHOL/HDL Ratio: 1.8 (calc) (ref <5.0)
Triglycerides: 60 mg/dL (ref <150)

## 2017-06-27 LAB — TSH: TSH: 1.2 m[IU]/L

## 2017-07-01 ENCOUNTER — Telehealth: Payer: Self-pay

## 2017-07-01 NOTE — Telephone Encounter (Signed)
-----   Message from Margaretann LovelessJennifer M Burnette, [place]PA-C sent at 07/01/2017  8:42 AM EST ----- All labs are within normal limits and stable.  Thanks! -JB

## 2017-07-01 NOTE — Telephone Encounter (Signed)
Patient advised as below.  

## 2018-02-07 ENCOUNTER — Ambulatory Visit: Payer: BC Managed Care – PPO | Admitting: Family Medicine

## 2018-02-07 ENCOUNTER — Encounter: Payer: Self-pay | Admitting: Family Medicine

## 2018-02-07 VITALS — BP 120/80 | HR 66 | Temp 97.4°F | Resp 15 | Wt 145.2 lb

## 2018-02-07 DIAGNOSIS — W57XXXA Bitten or stung by nonvenomous insect and other nonvenomous arthropods, initial encounter: Secondary | ICD-10-CM

## 2018-02-07 DIAGNOSIS — L539 Erythematous condition, unspecified: Secondary | ICD-10-CM

## 2018-02-07 MED ORDER — FLUOCINONIDE 0.05 % EX CREA: 1.0000 "application " | TOPICAL_CREAM | Freq: Three times a day (TID) | CUTANEOUS | 0 refills | Status: DC

## 2018-02-07 NOTE — Patient Instructions (Signed)
Use oral benadryl at night to help sleep and/or Claritin or similar during the day.

## 2018-02-07 NOTE — Progress Notes (Signed)
  Subjective:     Patient ID: Erin Vance, female   DOB: 09/04/1980, 37 y.o.   MRN: [id number] Chief Complaint  Patient presents with  . Rash    Patient comes in office today with concerns of possible insect bites or exposure to poison oak. Patient states yesterday she was working on [employer], [employer] and clearing out garden and states that she had noticed in the evening small red itchy bumps that appear to be around both ankles and arms.    HPI Has cleaned with a [employer] product but no there medications. Was not using an insect repellent.  Review of Systems     Objective:   Physical Exam  Constitutional: She appears well-developed and well-nourished. No distress.  Skin:  Multiple similar 0.5 cm wheals c/w insect bites on her feet/ankles, abdomen and back.( Abdomen and back not examined)       Assessment:    1. Multiple insect bites: start Lidex cream.     Plan:    Discussed use of insect repellents with DEET

## 2018-03-04 ENCOUNTER — Telehealth: Payer: Self-pay | Admitting: Physician Assistant

## 2018-03-04 NOTE — Telephone Encounter (Signed)
Pt is requesting Antony ContrasJenni return her call to discuss possibly getting Antony ContrasJenni to write her note excusing her from attending an event at work due to a previous situation. Please advise. Thanks TNP

## 2018-03-05 NOTE — Telephone Encounter (Signed)
Please Review

## 2018-03-06 NOTE — Telephone Encounter (Signed)
Attempted to call patient yesterday evening and today during lunch. There was no answer and voice mail has not been set up.

## 2018-03-07 NOTE — Telephone Encounter (Signed)
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 She said thank you for reaching out to her.

## 2018-03-07 NOTE — Telephone Encounter (Signed)
Oh ok perfect.

## 2018-06-30 ENCOUNTER — Ambulatory Visit (INDEPENDENT_AMBULATORY_CARE_PROVIDER_SITE_OTHER): Payer: BC Managed Care – PPO | Admitting: Physician Assistant

## 2018-06-30 ENCOUNTER — Other Ambulatory Visit (HOSPITAL_COMMUNITY)
Admission: RE | Admit: 2018-06-30 | Discharge: 2018-06-30 | Disposition: A | Discharge status: Home / Self Care | Payer: BC Managed Care – PPO | Source: Ambulatory Visit | Attending: Physician Assistant | Admitting: Physician Assistant

## 2018-06-30 ENCOUNTER — Encounter: Payer: Self-pay | Admitting: Physician Assistant

## 2018-06-30 VITALS — BP 110/68 | HR 76 | Temp 97.8°F | Wt 144.6 lb

## 2018-06-30 DIAGNOSIS — Z124 Encounter for screening for malignant neoplasm of cervix: Secondary | ICD-10-CM

## 2018-06-30 DIAGNOSIS — Z0000 Encounter for general adult medical examination without abnormal findings: Secondary | ICD-10-CM | POA: Diagnosis not present

## 2018-06-30 DIAGNOSIS — Z23 Encounter for immunization: Secondary | ICD-10-CM | POA: Diagnosis not present

## 2018-06-30 NOTE — Progress Notes (Signed)
Patient: Erin Vance, Female    DOB: Aug 07, 1980, 37 y.o.   MRN: [id number] Visit Date: 06/30/2018  Today's Provider: Mar Daring, PA-C   Chief Complaint  Patient presents with  . Annual Exam   Subjective:    Annual physical exam Erin Vance is a 37 y.o. female who presents today for health maintenance and complete physical. She feels well. She reports exercising 3 times a week. She reports she is sleeping well.  Patient is looking to try and conceive in the near future. -----------------------------------------------------------------   Review of Systems  Constitutional: Negative.   HENT: Negative.   Eyes: Negative.   Respiratory: Negative.   Cardiovascular: Negative.   Gastrointestinal: Negative.   Endocrine: Negative.   Genitourinary: Negative.   Musculoskeletal: Negative.   Skin: Negative.   Allergic/Immunologic: Negative.   Neurological: Negative.   Hematological: Negative.   Psychiatric/Behavioral: Negative.     Social History      She  reports that she has never smoked. She has never used smokeless tobacco. She reports that she drinks alcohol. She reports that she does not use drugs.       Social History   Socioeconomic History  . Marital status: Widowed    Spouse name: Not on file  . Number of children: Not on file  . Years of education: Not on file  . Highest education level: Not on file  Occupational History  . Not on file  Social Needs  . Financial resource strain: Not on file  . Food insecurity:    Worry: Not on file    Inability: Not on file  . Transportation needs:    Medical: Not on file    Non-medical: Not on file  Tobacco Use  . Smoking status: Never Smoker  . Smokeless tobacco: Never Used  Substance and Sexual Activity  . Alcohol use: Yes    Alcohol/week: 0.0 standard drinks    Comment: occasionally  . Drug use: No  . Sexual activity: Yes  Lifestyle  . Physical activity:    Days per week: Not on file   Minutes per session: Not on file  . Stress: Not on file  Relationships  . Social connections:    Talks on phone: Not on file    Gets together: Not on file    Attends religious service: Not on file    Active member of club or organization: Not on file    Attends meetings of clubs or organizations: Not on file    Relationship status: Not on file  Other Topics Concern  . Not on file  Social History Narrative  . Not on file    Past Medical History:  Diagnosis Date  . Anxiety      Patient Active Problem List   Diagnosis Date Noted  . Grief reaction 02/23/2015  . Hemorrhoid 01/11/2015  . 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 NO PAST SURGERIES      Family History        Family Status  Relation Name Status  . Mother  Alive  . Father  Alive  . Sister 1 Alive  . MGM  Alive  . PGF  Deceased  . Sister 2 Alive  . Mat Uncle  Alive        Her family history includes Bell's palsy in her sister; Breast cancer in her maternal grandmother; Diabetes in her sister; Heart attack  in her father and paternal grandfather; Hypertension in her mother; Kidney cancer (age of onset: 87) in her mother.      Allergies  Allergen Reactions  . Cephalexin      Current Outpatient Medications:  .  ALPRAZolam (XANAX) 0.25 MG tablet, TAKE 1 TO 2 TABLETS BY MOUTH EVERY 4 HOURS AS NEEDED FOR ANXIETY, Disp: 30 tablet, Rfl: 1 .  fluocinonide cream (LIDEX) 8.29 %, Apply 1 application topically 3 (three) times daily. To insect bites, Disp: 60 g, Rfl: 0   Patient Care Team: Rubye Beach as PCP - General (Physician Assistant)      Objective:   Vitals: BP 110/68 (BP Location: Left Arm, Patient Position: Sitting, Cuff Size: Normal)   Pulse 76   Temp 97.8 F (36.6 C) (Oral)   Wt 144 lb 9.6 oz (65.6 kg)   LMP 06/22/2018   SpO2 98%   BMI 21.99 kg/m    Vitals:   06/30/18 1409  BP: 110/68  Pulse: 76    Temp: 97.8 F (36.6 C)  TempSrc: Oral  SpO2: 98%  Weight: 144 lb 9.6 oz (65.6 kg)     Physical Exam  Constitutional: She is oriented to person, place, and time. She appears well-developed and well-nourished. No distress.  HENT:  Head: Normocephalic and atraumatic.  Right Ear: Hearing, tympanic membrane, external ear and ear canal normal.  Left Ear: Hearing, tympanic membrane, external ear and ear canal normal.  Nose: Nose normal.  Mouth/Throat: Uvula is midline, oropharynx is clear and moist and mucous membranes are normal. No oropharyngeal exudate.  Eyes: Pupils are equal, round, and reactive to light. Conjunctivae and EOM are normal. Right eye exhibits no discharge. Left eye exhibits no discharge. No scleral icterus.  Neck: Normal range of motion. Neck supple. No JVD present. Carotid bruit is not present. No tracheal deviation present. No thyromegaly present.  Cardiovascular: Normal rate, regular rhythm, normal heart sounds and intact distal pulses. Exam reveals no gallop and no friction rub.  No murmur heard. Pulmonary/Chest: Effort normal and breath sounds normal. No respiratory distress. She has no wheezes. She has no rales. She exhibits no tenderness. Right breast exhibits no inverted nipple, no mass, no nipple discharge, no skin change and no tenderness. Left breast exhibits no inverted nipple, no mass, no nipple discharge, no skin change and no tenderness. No breast tenderness, discharge or bleeding. Breasts are symmetrical.  Abdominal: Soft. Bowel sounds are normal. She exhibits no distension and no mass. There is no tenderness. There is no rebound and no guarding. Hernia confirmed negative in the right inguinal area and confirmed negative in the left inguinal area.  Genitourinary: Rectum normal, vagina normal and uterus normal. No breast tenderness, discharge or bleeding. Pelvic exam was performed with patient supine. There is no rash, tenderness, lesion or injury on the right labia.  There is no rash, tenderness, lesion or injury on the left labia. Cervix exhibits no motion tenderness, no discharge and no friability. Right adnexum displays no mass, no tenderness and no fullness. Left adnexum displays no mass, no tenderness and no fullness. No erythema, tenderness or bleeding in the vagina. No signs of injury around the vagina. No vaginal discharge found.  Musculoskeletal: Normal range of motion. She exhibits no edema or tenderness.  Lymphadenopathy:    She has no cervical adenopathy.       Right: No inguinal adenopathy present.       Left: No inguinal adenopathy present.  Neurological: She is alert and oriented  to person, place, and time. She has normal reflexes. No cranial nerve deficit. Coordination normal.  Skin: Skin is warm and dry. No rash noted. She is not diaphoretic.  Psychiatric: She has a normal mood and affect. Her behavior is normal. Judgment and thought content normal.  Vitals reviewed.    Depression Screen PHQ 2/9 Scores 06/30/2018 06/26/2017 03/11/2015 01/26/2015  PHQ - 2 Score 0 1 0 0  PHQ- 9 Score 1 - - -      Assessment & Plan:     Routine Health Maintenance and Physical Exam  Exercise Activities and Dietary recommendations Goals   None     Immunization History  Administered Date(s) Administered  . DTaP 09/28/1980, 11/26/1980, 02/17/1981, 06/22/1982  . HPV Quadrivalent 07/12/2006, 09/19/2006, 01/22/2007  . Hepatitis B 10/27/1997, 11/29/1997, 08/17/1998  . IPV 09/28/1980, 11/26/1980, 06/22/1982, 03/04/1986  . Influenza,inj,Quad PF,6+ Mos 04/20/2016  . MMR 06/22/1982, 12/07/1998  . Td 12/07/1998  . Tdap 01/20/2013    Health Maintenance  Topic Date Due  . INFLUENZA VACCINE  03/06/2018  . PAP SMEAR  03/10/2018  . TETANUS/TDAP  01/21/2023  . HIV Screening  Completed     Discussed health benefits of physical activity, and encouraged her to engage in regular exercise appropriate for her age and condition.    1. Annual physical  exam Normal physical exam today. Will check labs as below and f/u pending lab results. If labs are stable and WNL she will not need to have these rechecked for one year at her next annual physical exam. She is to call the office in the meantime if she has any acute issue, questions or concerns. - CBC with Differential/Platelet - Comprehensive metabolic panel - Hemoglobin A1c - Lipid panel - TSH  2. Cervical cancer screening Pap collected today. Will send as below and f/u pending results. - Cytology - PAP  3. Need for influenza vaccination Flu vaccine given today without complication. Patient sat upright for 15 minutes to check for adverse reaction before being released. - Flu Vaccine QUAD 36+ mos IM  --------------------------------------------------------------------    Mar Daring, PA-C  [facility]

## 2018-06-30 NOTE — Patient Instructions (Signed)

## 2018-07-02 ENCOUNTER — Telehealth: Payer: Self-pay

## 2018-07-02 LAB — CYTOLOGY - PAP
ADEQUACY: ABSENT
Diagnosis: NEGATIVE
HPV (WINDOPATH): NOT DETECTED

## 2018-07-02 NOTE — Telephone Encounter (Signed)
Patient advised as directed below. 

## 2018-07-02 NOTE — Telephone Encounter (Signed)
-----   Message from Margaretann LovelessJennifer M Burnette, [place] sent at 07/02/2018  2:51 PM EST ----- Pap is normal, HPV negative.  Will repeat in 3-5 years.

## 2018-08-15 ENCOUNTER — Telehealth: Payer: Self-pay | Admitting: Physician Assistant

## 2018-08-15 DIAGNOSIS — N760 Acute vaginitis: Principal | ICD-10-CM

## 2018-08-15 DIAGNOSIS — B9689 Other specified bacterial agents as the cause of diseases classified elsewhere: Secondary | ICD-10-CM

## 2018-08-15 NOTE — Telephone Encounter (Signed)
Pt needing to ask some questions.  Wanting to speak with OglesbyJenni personally.  Thanks, [employer]

## 2018-08-18 MED ORDER — METRONIDAZOLE 500 MG PO TABS: 500.0000 mg | ORAL_TABLET | Freq: Two times a day (BID) | ORAL | 0 refills | Status: DC

## 2018-08-18 NOTE — Telephone Encounter (Signed)
Patient having vaginal odor. No discharge, burning, pain, urinary changes. Has been trying to get pregnant. Has had BV before and reports symptoms consistent. Metronidazole sent in.

## 2018-08-18 NOTE — Telephone Encounter (Signed)
Please review. KW 

## 2018-09-05 ENCOUNTER — Ambulatory Visit: Payer: BC Managed Care – PPO | Admitting: Physician Assistant

## 2018-09-05 ENCOUNTER — Encounter: Payer: Self-pay | Admitting: Physician Assistant

## 2018-09-05 VITALS — BP 120/80 | HR 72 | Temp 97.6°F | Resp 16 | Wt 145.0 lb

## 2018-09-05 DIAGNOSIS — N760 Acute vaginitis: Secondary | ICD-10-CM

## 2018-09-05 DIAGNOSIS — R300 Dysuria: Secondary | ICD-10-CM | POA: Diagnosis not present

## 2018-09-05 DIAGNOSIS — B9689 Other specified bacterial agents as the cause of diseases classified elsewhere: Secondary | ICD-10-CM

## 2018-09-05 DIAGNOSIS — N898 Other specified noninflammatory disorders of vagina: Secondary | ICD-10-CM | POA: Diagnosis not present

## 2018-09-05 LAB — POCT URINALYSIS DIPSTICK
Appearance: NORMAL
Bilirubin, UA: NEGATIVE
GLUCOSE UA: NEGATIVE
LEUKOCYTES UA: NEGATIVE
NITRITE UA: NEGATIVE
Odor: NORMAL
PROTEIN UA: POSITIVE — AB
Spec Grav, UA: >=1.03 — AB (ref 1.010–1.025)
Urobilinogen, UA: 0.2 E.U./dL
pH, UA: 6 (ref 5.0–8.0)

## 2018-09-05 LAB — POCT URINE PREGNANCY: Preg Test, Ur: NEGATIVE

## 2018-09-05 MED ORDER — METRONIDAZOLE 500 MG PO TABS: 500.0000 mg | ORAL_TABLET | Freq: Two times a day (BID) | ORAL | 0 refills | Status: DC

## 2018-09-05 NOTE — Progress Notes (Signed)
Patient: Erin Vance Female    DOB: [birth year]   38 y.o.   MRN: [id number] Visit Date: 09/05/2018  Today's Provider: Margaretann Loveless, PA-C   Chief Complaint  Patient presents with  . Vaginal Discharge   Subjective:     Vaginal Discharge  The patient's primary symptoms include a genital odor and vaginal discharge. The patient's pertinent negatives include no genital itching, genital lesions, genital rash, missed menses or pelvic pain. This is a new problem. The current episode started 1 to 4 weeks ago (3 weeks). The problem has been unchanged. The patient is experiencing no pain. Associated symptoms include discolored urine and dysuria. Pertinent negatives include no abdominal pain, anorexia, back pain, chills, constipation, diarrhea, fever, flank pain, frequency, headaches, hematuria, joint pain, joint swelling, nausea, painful intercourse, rash, sore throat, urgency or vomiting. The vaginal discharge was brown. The vaginal bleeding is typical of menses. She has not been passing clots. She has not been passing tissue. Nothing aggravates the symptoms. Her menstrual history has been regular.    Patient called 08/15/2018 concerning having a vaginal discharge. Patient was given an rx for Flagyl, which she has completed. Patient states she is still having vaginal odor and brown discharge. No pain. No itching.  Allergies  Allergen Reactions  . Cephalexin      Current Outpatient Medications:  .  ALPRAZolam (XANAX) 0.25 MG tablet, TAKE 1 TO 2 TABLETS BY MOUTH EVERY 4 HOURS AS NEEDED FOR ANXIETY, Disp: 30 tablet, Rfl: 1 .  fluocinonide cream (LIDEX) 0.05 %, Apply 1 application topically 3 (three) times daily. To insect bites, Disp: 60 g, Rfl: 0 .  metroNIDAZOLE (FLAGYL) 500 MG tablet, Take 1 tablet (500 mg total) by mouth 2 (two) times daily. (Patient not taking: Reported on 09/05/2018), Disp: 14 tablet, Rfl: 0  Review of Systems  Constitutional: Negative for appetite change,  chills, fatigue and fever.  HENT: Negative for sore throat.   Respiratory: Negative for chest tightness and shortness of breath.   Cardiovascular: Negative for chest pain and palpitations.  Gastrointestinal: Negative for abdominal pain, anorexia, constipation, diarrhea, nausea and vomiting.  Genitourinary: Positive for dysuria and vaginal discharge. Negative for flank pain, frequency, hematuria, missed menses, pelvic pain, urgency, vaginal bleeding and vaginal pain.  Musculoskeletal: Negative for back pain and joint pain.  Skin: Negative for rash.  Neurological: Negative for dizziness, weakness and headaches.    Social History   Tobacco Use  . Smoking status: Never Smoker  . Smokeless tobacco: Never Used  Substance Use Topics  . Alcohol use: Yes    Alcohol/week: 0.0 standard drinks    Comment: occasionally      Objective:   BP 120/80 (BP Location: Right Arm, Patient Position: Sitting, Cuff Size: Normal)   Pulse 72   Temp 97.6 F (36.4 C) (Oral)   Resp 16   Wt 145 lb (65.8 kg)   LMP 08/27/2018   SpO2 99%   BMI 22.05 kg/m  Vitals:   09/05/18 0711  BP: 120/80  Pulse: 72  Resp: 16  Temp: 97.6 F (36.4 C)  TempSrc: Oral  SpO2: 99%  Weight: 145 lb (65.8 kg)     Physical Exam Constitutional:      General: She is not in acute distress.    Appearance: Normal appearance. She is well-developed. She is not diaphoretic.  Cardiovascular:     Rate and Rhythm: Normal rate and regular rhythm.     Heart sounds: Normal heart sounds. No  murmur. No friction rub. No gallop.   Pulmonary:     Effort: Pulmonary effort is normal. No respiratory distress.     Breath sounds: Normal breath sounds. No wheezing or rales.  Abdominal:     General: Bowel sounds are normal. There is no distension.     Palpations: Abdomen is soft. There is no mass.     Tenderness: There is no abdominal tenderness. There is no guarding or rebound.  Skin:    General: Skin is warm and dry.  Neurological:      Mental Status: She is alert and oriented to person, place, and time.        Assessment & Plan    1. Vaginal discharge UA fairly normal. Urine pregnancy negative today.  - POCT urinalysis dipstick - POCT urine pregnancy  2. BV (bacterial vaginosis) Suspect recurrence of BV. Will treat as below. Discussed using metrogel prophylactically after intercourse to prevent recurrence if she develops a 3rd BV infection.  - metroNIDAZOLE (FLAGYL) 500 MG tablet; Take 1 tablet (500 mg total) by mouth 2 (two) times daily.  Dispense: 14 tablet; Refill: 0  3. Dysuria Urine sent for culture. Will f/u pending results.  - Urine Culture     Margaretann Loveless, PA-C  [facility]

## 2018-09-07 LAB — URINE CULTURE

## 2018-09-07 LAB — SPECIMEN STATUS REPORT

## 2018-09-08 ENCOUNTER — Telehealth: Payer: Self-pay

## 2018-09-08 NOTE — Telephone Encounter (Signed)
Patient was advised of results. Patient states her boyfriend said he had a sore spot on his genital area asked her to check to make sure nothing wasn't stuck in her vagina and patient states she found a tampon stuck in her vagina. Patient states she got the tampon out and wants to know if she should come back in to get rechecked after finishing the second around of antibiotics. Patient states she is trying to started a family and feeling concerned since that occurred.

## 2018-09-08 NOTE — Telephone Encounter (Signed)
Patient was advised.  

## 2018-09-08 NOTE — Telephone Encounter (Signed)
-----   Message from Margaretann Loveless, [place] sent at 09/08/2018  7:38 AM EST ----- Urine culture had a small amount of bacteria present that should not cause clinical symptoms. Have symptoms improved with metronidazole for BV?

## 2018-09-08 NOTE — Telephone Encounter (Signed)
No treatment necessary. Call if symptoms persist.

## 2018-10-27 ENCOUNTER — Ambulatory Visit: Payer: BC Managed Care – PPO | Admitting: Physician Assistant

## 2018-10-27 ENCOUNTER — Encounter: Payer: Self-pay | Admitting: Physician Assistant

## 2018-10-27 ENCOUNTER — Other Ambulatory Visit: Payer: Self-pay

## 2018-10-27 VITALS — BP 130/85 | HR 71 | Temp 98.4°F | Resp 16 | Ht 61.0 in | Wt 149.2 lb

## 2018-10-27 DIAGNOSIS — N6320 Unspecified lump in the left breast, unspecified quadrant: Secondary | ICD-10-CM | POA: Diagnosis not present

## 2018-10-27 NOTE — Patient Instructions (Signed)
Breast Cyst    A breast cyst is a sac in the breast that is filled with fluid. Breast cysts are usually noncancerous (benign). They are common among women, and they are most often located in the upper, outer portion of the breast. One or more cysts may develop. They form when fluid builds up inside of the breast glands.  There are several types of breast cysts:  · Macrocyst. This is a cyst that is about 2 inches (5.1 cm) across (in diameter).  · Microcyst. This is a very small cyst that you cannot feel, but it can be seen with imaging tests such as an X-ray of the breast (mammogram) or ultrasound.  · Galactocele. This is a cyst that contains milk. It may develop if you suddenly stop breastfeeding.  Breast cysts do not increase your risk of breast cancer. They usually disappear after menopause, unless you take artificial hormones (are on hormone therapy).  What are the causes?  The exact cause of breast cysts is not known. Possible causes include:  · Blockage of tubes (ducts) in the breast glands, which leads to fluid buildup. Duct blockage may result from:  ? Fibrocystic breast changes. This is a common, benign condition that occurs when women go through hormonal changes during the menstrual cycle. This is a common cause of multiple breast cysts.  ? Overgrowth of breast tissue or breast glands.  ? Scar tissue in the breast from previous surgery.  · Changes in certain female hormones (estrogen and progesterone).  What increases the risk?  You may be more likely to develop breast cysts if you have not gone through menopause.  What are the signs or symptoms?  Symptoms of a breast cyst may include:  · Feeling one or more smooth, round, soft lumps (like grapes) in the breast that are easily moveable. The lump(s) may get bigger and more painful before your period and get smaller after your period.  · Breast discomfort or pain.  How is this diagnosed?  A cyst can be felt during a physical exam by your health care provider.  A mammogram and ultrasound will be done to confirm the diagnosis. Fluid may be removed from the cyst with a needle (fine-needle aspiration) and tested to make sure the cyst is not cancerous.  How is this treated?  Treatment may not be necessary. Your health care provider may monitor the cyst to see if it goes away on its own. If the cyst is uncomfortable or gets bigger, or if you do not like how the cyst makes your breast look, you may need treatment. Treatment may include:  · Hormone treatment.  · Fine-needle aspiration, to drain fluid from the cyst. There is a chance of the cyst coming back (recurring) after aspiration.  · Surgery to remove the cyst.  Follow these instructions at home:  · See your health care provider regularly.  ? Get a yearly physical exam.  ? If you are 20-40 years old, get a clinical breast exam every 1-3 years. After age 40, get this exam every year.  ? Get mammograms as often as directed.  · Do a breast self-exam every month, or as often as directed. Having many breast cysts, or “lumpy” breasts, may make it harder to feel for new lumps. Understand how your breasts normally look and feel, and write down any changes in your breasts so you can tell your health care provider about the changes. A breast self-exam involves:  ? Comparing your breasts in   the mirror.  ? Looking for visible changes in your skin or nipples.  ? Feeling for lumps or changes.  · Take over-the-counter and prescription medicines only as told by your health care provider.  · Wear a supportive bra, especially when exercising.  · Follow instructions from your health care provider about eating and drinking restrictions.  ? Avoid caffeine.  ? Cut down on salt (sodium) in what you eat and drink, especially before your menstrual period. Too much sodium can cause fluid buildup (retention), breast swelling, and discomfort.  · Keep all follow-up visits as told your health care provider. This is important.  Contact a health care  provider if:  · You feel, or think you feel, a lump in your breast.  · You notice that both breasts look or feel different than usual.  · Your breast is still causing pain after your menstrual period is over.  · You find new lumps or bumps that were not there before.  · You feel lumps in your armpit (axilla).  Get help right away if:  · You have severe pain, tenderness, redness, or warmth in your breast.  · You have fluid or blood leaking from your nipple.  · Your breast lump becomes hard and painful.  · You notice dimpling or wrinkling of the breast or nipple.  This information is not intended to replace advice given to you by your health care provider. Make sure you discuss any questions you have with your health care provider.  Document Released: 07/23/2005 Document Revised: 04/13/2016 Document Reviewed: 04/13/2016  [employer]vier Interactive Patient Education © 2019 [employer]

## 2018-10-27 NOTE — Progress Notes (Signed)
Patient: Erin Vance Female    DOB: Feb 02, 1981   38 y.o.   MRN: [id number] Visit Date: 10/27/2018  Today's Provider: Margaretann Loveless, PA-C   Chief Complaint  Patient presents with  . Breast Pain   Subjective:     HPI Patient here today c/o or lump on lower side of left breast. Patient denies any redness, rash or discharge. Does have family history of breast cancer in her Paternal grandmother.  Patient reports she has not started her period that was suppose to start on the 20th of this month. Patient reports she did a pregnancy test and that was negative. 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   Allergies  Allergen Reactions  . Cephalexin      Current Outpatient Medications:  .  ALPRAZolam (XANAX) 0.25 MG tablet, TAKE 1 TO 2 TABLETS BY MOUTH EVERY 4 HOURS AS NEEDED FOR ANXIETY, Disp: 30 tablet, Rfl: 1 .  fluocinonide cream (LIDEX) 0.05 %, Apply 1 application topically 3 (three) times daily. To insect bites, Disp: 60 g, Rfl: 0  Review of Systems  Constitutional: Negative.   Respiratory: Negative.   Cardiovascular: Negative.        Breast pain and lump  Gastrointestinal: Negative.   Genitourinary: Positive for menstrual problem.  Musculoskeletal: Positive for myalgias.  Neurological: Negative.     Social History   Tobacco Use  . Smoking status: Never Smoker  . Smokeless tobacco: Never Used  Substance Use Topics  . Alcohol use: Yes    Alcohol/week: 0.0 standard drinks    Comment: occasionally      Objective:   BP 130/85 (BP Location: Left Arm, Patient Position: Sitting, Cuff Size: Normal)   Pulse 71   Temp 98.4 F (36.9 C) (Oral)   Resp 16   Ht 5\' 1"  (1.549 m)   Wt 149 lb 3.2 oz (67.7 kg)   BMI 28.19 kg/m  Vitals:   10/27/18 1330  BP: 130/85  Pulse: 71  Resp: 16  Temp: 98.4 F (36.9 C)  TempSrc: Oral  Weight: 149 lb 3.2 oz (67.7 kg)   Height: 5\' 1"  (1.549 m)     Physical Exam Vitals signs reviewed.  Constitutional:      General: She is not in acute distress.    Appearance: Normal appearance. She is well-developed and normal weight. She is not ill-appearing or diaphoretic.  Neck:     Musculoskeletal: Normal range of motion and neck supple.  Cardiovascular:     Rate and Rhythm: Normal rate and regular rhythm.     Heart sounds: Normal heart sounds. No murmur. No friction rub. No gallop.   Pulmonary:     Effort: Pulmonary effort is normal. No respiratory distress.     Breath sounds: Normal breath sounds. No wheezing or rales.  Chest:     Breasts:        Right: No mass, nipple discharge, skin change or tenderness.        Left: Mass and tenderness present. No swelling, nipple discharge or skin change.    Lymphadenopathy:     Upper Body:     Right upper body: No supraclavicular, axillary or pectoral adenopathy.     Left upper body: No supraclavicular, axillary or pectoral adenopathy.  Neurological:     Mental Status: She is alert.  Assessment & Plan    1. Breast mass, left Suspect cyst, but imaging ordered to r/o other concerning cause since she has a positive family history (paternal side) for breast cancer. She is to call if the mass becomes larger in size or changes. I will f/u pending results. - MM DIAG BREAST TOMO BILATERAL; Future - US BREAST COMPLETE UNI LEFT INC AXILLA; Future     Margaretann Loveless, PA-C  [facility]

## 2018-10-28 ENCOUNTER — Telehealth: Payer: Self-pay | Admitting: Physician Assistant

## 2018-10-28 DIAGNOSIS — N63 Unspecified lump in unspecified breast: Secondary | ICD-10-CM

## 2018-10-28 NOTE — Telephone Encounter (Signed)
Erin Vance states they will need an order for right and left breast ultrasounds (LIMITED) [id number] and [id number]

## 2018-10-29 NOTE — Telephone Encounter (Signed)
Ordered

## 2018-11-03 ENCOUNTER — Other Ambulatory Visit: Payer: Self-pay

## 2018-11-03 ENCOUNTER — Telehealth: Payer: Self-pay

## 2018-11-03 ENCOUNTER — Ambulatory Visit
Admission: RE | Admit: 2018-11-03 | Discharge: 2018-11-03 | Disposition: A | Discharge status: Home / Self Care | Payer: BC Managed Care – PPO | Source: Ambulatory Visit | Attending: Physician Assistant | Admitting: Physician Assistant

## 2018-11-03 DIAGNOSIS — N6320 Unspecified lump in the left breast, unspecified quadrant: Secondary | ICD-10-CM | POA: Diagnosis present

## 2018-11-03 DIAGNOSIS — N63 Unspecified lump in unspecified breast: Secondary | ICD-10-CM | POA: Insufficient documentation

## 2018-11-03 NOTE — Telephone Encounter (Signed)
-----   Message from Margaretann Loveless, [place] sent at 11/03/2018  1:18 PM EDT ----- Mammogram and US showed some normal glandular tissue. No area of concern.

## 2018-11-03 NOTE — Telephone Encounter (Signed)
Patient advised as directed below. 

## 2019-03-09 ENCOUNTER — Ambulatory Visit: Payer: BC Managed Care – PPO | Admitting: Physician Assistant

## 2019-03-09 ENCOUNTER — Encounter: Payer: Self-pay | Admitting: Physician Assistant

## 2019-03-09 ENCOUNTER — Other Ambulatory Visit: Payer: Self-pay

## 2019-03-09 VITALS — BP 122/80 | HR 87 | Temp 98.3°F | Resp 16 | Wt 148.4 lb

## 2019-03-09 DIAGNOSIS — Z3A01 Less than 8 weeks gestation of pregnancy: Secondary | ICD-10-CM

## 2019-03-09 DIAGNOSIS — Z3200 Encounter for pregnancy test, result unknown: Secondary | ICD-10-CM

## 2019-03-09 LAB — POCT URINE PREGNANCY: Preg Test, Ur: POSITIVE — AB

## 2019-03-09 NOTE — Patient Instructions (Signed)
Pregnancy After Age 38 Women who become pregnant after the age of 38 have a higher risk for certain problems during pregnancy. This is because older women may already have health problems before becoming pregnant. Older women who are healthy before pregnancy may still develop problems during pregnancy. These problems may affect the mother, the unborn baby (fetus), or both. What are the risks for me? If you are over age 38 and you want to become pregnant or are pregnant, you may have a higher risk of:  Not being able to get pregnant (infertility).  Going into labor early (preterm labor).  Needing surgical delivery of your baby (cesarean delivery, or C-section).  Having high blood pressure (hypertension).  Having complications during pregnancy, such as high blood pressure and other symptoms (preeclampsia).  Having diabetes during pregnancy (gestational diabetes).  Being pregnant with more than one baby.  Loss of the unborn baby before 20 weeks (miscarriage) or after 20 weeks of pregnancy (stillbirth). What are the risks for my baby? Babies born to women over the age of 38 have a higher risk for:  Being born early (prematurity).  Low birth weight, which is less than 5 lb, 8 oz (2.5 kg).  Birth defects, such as Down syndrome and cleft palate.  Health complications, including problems with growth and development. How is prenatal care different for women over age 38? All women should see their health care provider before they try to become pregnant. This is especially important for women over the age of 38. Tell your health care provider about:  Any health problems you have.  Any medicines you take.  Any family history of health problems or chromosome-related defects.  Any problems you have had with past pregnancies or deliveries. If you are over age 38 and you plan to become pregnant:  Start taking a daily multivitamin a month or more before you try to get pregnant. Your  multivitamin should contain 400 mcg (micrograms) of folic acid. If you are over age 38 and pregnant, make sure you:  Keep taking your multivitamin unless your health care provider tells you not to take it.  Keep all prenatal visits as told by your health care provider. This is important.  Have ultrasounds regularly throughout your pregnancy to check for problems.  Talk with your health care provider about other prenatal screening tests that you may need. What additional prenatal tests are needed? Screening tests show whether your baby has a higher risk for birth defects than other babies. Screening tests include:  Ultrasound tests to look for markers that indicate a risk for birth defects.  Maternal blood screening. These are blood tests that measure certain substances in your blood to determine your baby's risk for defects. Screening tests do not show whether your baby has or does not have defects. They only show your baby's risk for certain defects. If your screening tests show that risk factors are present, you may need tests to confirm the defect (diagnostic testing). These tests may include:  Chorionic villus sampling. For this procedure, a tissue sample is taken from the organ that forms in your uterus to nourish your baby (placenta). The sample is removed through your cervix or abdomen and tested.  Amniocentesis. For this procedure, a small amount of the fluid that surrounds the baby in the uterus (amniotic fluid) is removed and tested. What can I do to stay healthy during my pregnancy? Staying healthy during pregnancy can help you and your baby to have a lower risk for   problems during pregnancy, during delivery, or both. Talk with your health care provider for specific instructions about staying healthy during your pregnancy. Nutrition   At each meal, eat a variety of foods from each of the five food groups. These groups include: ? Proteins such as lean meats, poultry, fish that is  low in fat, beans, eggs, and nuts. ? Vegetables such as leafy greens, raw and cooked vegetables, and vegetable juice. ? Fruits that are fresh, frozen, or canned, or 100% fruit juice. ? Dairy products such as low-fat yogurt, cheese, and milk. ? Whole grains including rice, cereal, pasta, and bread.  Talk with your health care provider about how much food in each group is right for you.  Follow instructions from your health care provider about eating and drinking restrictions during pregnancy. ? Do not eat raw eggs, raw meat, or raw fish or seafood. ? Do not eat any fish that contains high amounts of mercury, such as swordfish or mackerel.  Drink 6-8 or more glasses of water a day. You should drink enough fluid to keep your urine pale yellow. Managing weight gain  Ask your health care provider how much weight gain is healthy during pregnancy.  Stay at a healthy weight. If needed, work with your health care provider to lose weight safely. Activity  Exercise regularly, as directed by your health care provider. Ask your health care provider what forms of exercise are safe for you. General instructions  Do not use any products that contain nicotine or tobacco, such as cigarettes and e-cigarettes. If you need help quitting, ask your health care provider.  Do not drink alcohol, use drugs, or abuse prescription medicine.  Take over-the-counter and prescription medicines only as told by your health care provider.  Do not use hot tubs, steam rooms, or saunas.  Talk with your health care provider about your risk of exposure to harmful environmental conditions. This includes exposure to chemicals, radiation, cleaning products, and cat feces. Follow advice from your health care provider about how to limit your exposure. Summary  Women who become pregnant after the age of 38 have a higher risk for complications during pregnancy.  Problems may affect the mother, the unborn baby (fetus), or  both.  All women should see their health care provider before they try to become pregnant. This is especially important for women over the age of 38.  Staying healthy during pregnancy can help both you and your baby to have a lower risk for some of the problems that can happen during pregnancy, during delivery, or both. This information is not intended to replace advice given to you by your health care provider. Make sure you discuss any questions you have with your health care provider. Document Released: 11/12/2016 Document Revised: 11/14/2018 Document Reviewed: 11/12/2016 Elsevier Patient Education  2020 Elsevier Inc.  

## 2019-03-09 NOTE — Progress Notes (Signed)
Patient: Erin Vance Female    DOB: 13-Jul-1981   38 y.o.   MRN: [id number] Visit Date: 03/09/2019  Today's Provider: Mar Daring, PA-C   No chief complaint on file.  Subjective:     HPI  Patient presenting today for  pregancy test. LMP was 06/27-07/01. She took a home test and it was possibly positive. She and her SO have been trying to conceive for approx 6 months. She does have pertinent h/o elective abortion approx 2-3 years ago. Her husband had recently committed suicide and her and same partner accidentally became pregnant. She was not in a mental state that was capable of continuing pregnancy and had the abortion.   Allergies  Allergen Reactions  . Cephalexin      Current Outpatient Medications:  .  ALPRAZolam (XANAX) 0.25 MG tablet, TAKE 1 TO 2 TABLETS BY MOUTH EVERY 4 HOURS AS NEEDED FOR ANXIETY, Disp: 30 tablet, Rfl: 1 .  fluocinonide cream (LIDEX) 4.13 %, Apply 1 application topically 3 (three) times daily. To insect bites, Disp: 60 g, Rfl: 0  Review of Systems  Constitutional: Negative.   Respiratory: Negative.   Cardiovascular: Negative.   Gastrointestinal: Negative.   Genitourinary: Positive for menstrual problem.  Neurological: Negative.   Psychiatric/Behavioral: Negative.     Social History   Tobacco Use  . Smoking status: Never Smoker  . Smokeless tobacco: Never Used  Substance Use Topics  . Alcohol use: Yes    Alcohol/week: 0.0 standard drinks    Comment: occasionally      Objective:   BP 122/80 (BP Location: Left Arm, Patient Position: Sitting, Cuff Size: Normal)   Pulse 87   Temp 98.3 F (36.8 C) (Oral)   Resp 16   Wt 148 lb 6.4 oz (67.3 kg)   BMI 28.04 kg/m  Vitals:   03/09/19 1323  BP: 122/80  Pulse: 87  Resp: 16  Temp: 98.3 F (36.8 C)  TempSrc: Oral  Weight: 148 lb 6.4 oz (67.3 kg)     Physical Exam Vitals signs reviewed.  Constitutional:      General: She is not in acute distress.    Appearance: Normal  appearance. She is well-developed. She is not ill-appearing or diaphoretic.  Neck:     Musculoskeletal: Normal range of motion and neck supple.  Cardiovascular:     Rate and Rhythm: Normal rate and regular rhythm.     Heart sounds: Normal heart sounds. No murmur. No friction rub. No gallop.   Pulmonary:     Effort: Pulmonary effort is normal. No respiratory distress.     Breath sounds: Normal breath sounds. No wheezing or rales.  Neurological:     Mental Status: She is alert.      Results for orders placed or performed in visit on 03/09/19  POCT urine pregnancy  Result Value Ref Range   Preg Test, Ur Positive (A) Negative       Assessment & Plan    1. Possible pregnancy UA pregnancy today is positive in office. Will get HCG to get approximate time frame. I suspect 4-5 weeks. Referral also placed to [facility]. This is same facility she has seen in the past. Will stop Alprazolam. Start prenatal supplement. Call if questions or concerns arise.  - POCT urine pregnancy - Beta HCG, Quant  2. Less than [redacted] weeks gestation of pregnancy See above medical treatment plan. - Ambulatory referral to Obstetrics / Gynecology     Mar Daring,  PA-C  [facility]

## 2019-03-10 ENCOUNTER — Telehealth: Payer: Self-pay

## 2019-03-10 LAB — BETA HCG QUANT (REF LAB): hCG Quant: 899 m[IU]/mL

## 2019-03-10 NOTE — Telephone Encounter (Signed)
-----   Message from Mar Daring, [place] sent at 03/10/2019  1:48 PM EDT ----- HCG is positive and shows you are possibly between 4-[redacted] weeks along.

## 2019-03-10 NOTE — Telephone Encounter (Signed)
Patient was advised.  

## 2019-03-14 ENCOUNTER — Encounter: Payer: Self-pay | Admitting: Physician Assistant

## 2019-04-22 ENCOUNTER — Encounter: Payer: Self-pay | Admitting: Physician Assistant

## 2019-07-01 ENCOUNTER — Encounter: Payer: Self-pay | Admitting: Physician Assistant

## 2019-07-01 ENCOUNTER — Other Ambulatory Visit: Payer: Self-pay | Admitting: Physician Assistant

## 2019-07-01 DIAGNOSIS — F418 Other specified anxiety disorders: Secondary | ICD-10-CM

## 2019-07-01 MED ORDER — ALPRAZOLAM 0.25 MG PO TABS: 0.2500 mg | ORAL_TABLET | Freq: Two times a day (BID) | ORAL | 0 refills | Status: DC | PRN

## 2019-07-01 NOTE — Telephone Encounter (Signed)
ALPRAZolam (XANAX) 0.25 MG tablet   Patient is requesting refill.    Pharmacy:  [id number] - [place], [place] [phone] (Phone) [phone] (Fax)

## 2019-07-01 NOTE — Progress Notes (Deleted)
{Method of visit:23308}   Patient: Erin Vance, Female    DOB: Sep 14, 1980, 38 y.o.   MRN: [id number] Visit Date: 07/01/2019  Today's Provider: Mar Daring, PA-C   No chief complaint on file.  Subjective:     Annual physical exam Valery Amedee is a 38 y.o. female who presents today for health maintenance and complete physical. She feels {DESC; WELL/FAIRLY WELL/POORLY:18703}. She reports exercising ***. She reports she is sleeping {DESC; WELL/FAIRLY WELL/POORLY:18703}.  ----------------------------------------------------------------- 11/03/2018 -Mammogram and US showed some normal glandular tissue. No area of concern. 07/02/2018 -Pap is normal, HPV negative. Will repeat in 3-5 years.  Review of Systems  Social History      She  reports that she has never smoked. She has never used smokeless tobacco. She reports current alcohol use. She reports that she does not use drugs.       Social History   Socioeconomic History  . Marital status: Widowed    Spouse name: Not on file  . Number of children: Not on file  . Years of education: Not on file  . Highest education level: Not on file  Occupational History  . Not on file  Social Needs  . Financial resource strain: Not on file  . Food insecurity    Worry: Not on file    Inability: Not on file  . Transportation needs    Medical: Not on file    Non-medical: Not on file  Tobacco Use  . Smoking status: Never Smoker  . Smokeless tobacco: Never Used  Substance and Sexual Activity  . Alcohol use: Yes    Alcohol/week: 0.0 standard drinks    Comment: occasionally  . Drug use: No  . Sexual activity: Yes  Lifestyle  . Physical activity    Days per week: Not on file    Minutes per session: Not on file  . Stress: Not on file  Relationships  . Social Herbalist on phone: Not on file    Gets together: Not on file    Attends religious service: Not on file    Active member of club or organization: Not  on file    Attends meetings of clubs or organizations: Not on file    Relationship status: Not on file  Other Topics Concern  . Not on file  Social History Narrative  . Not on file    Past Medical History:  Diagnosis Date  . Anxiety      Patient Active Problem List   Diagnosis Date Noted  . Grief reaction 02/23/2015  . Hemorrhoid 01/11/2015  . 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 NO PAST SURGERIES      Family History        Family Status  Relation Name Status  . Mother  Alive  . Father  Alive  . Sister 1 Alive  . MGM  Alive  . PGF  Deceased  . Sister 2 Alive  . Mat Uncle  Alive        Her family history includes Bell's palsy in her sister; Breast cancer in her maternal grandmother; Diabetes in her sister; Heart attack in her father and paternal grandfather; Hypertension in her mother; Kidney cancer (age of onset: 29) in her mother.      Allergies  Allergen Reactions  . Cephalexin      Current Outpatient Medications:  .  fluocinonide cream (LIDEX) 0.05 %,  Apply 1 application topically 3 (three) times daily. To insect bites, Disp: 60 g, Rfl: 0   Patient Care Team: Rubye Beach as PCP - General (Physician Assistant)    Objective:    Vitals: There were no vitals taken for this visit.  There were no vitals filed for this visit.   Physical Exam   Depression Screen PHQ 2/9 Scores 06/30/2018 06/26/2017 03/11/2015 01/26/2015  PHQ - 2 Score 0 1 0 0  PHQ- 9 Score 1 - - -       Assessment & Plan:     Routine Health Maintenance and Physical Exam  Exercise Activities and Dietary recommendations Goals   None     Immunization History  Administered Date(s) Administered  . DTaP 09/28/1980, 11/26/1980, 02/17/1981, 06/22/1982  . HPV Quadrivalent 07/12/2006, 09/19/2006, 01/22/2007  . Hepatitis B 10/27/1997, 11/29/1997, 08/17/1998  . IPV 09/28/1980, 11/26/1980,  06/22/1982, 03/04/1986  . Influenza Split 06/28/2012  . Influenza,inj,Quad PF,6+ Mos 04/20/2016, 06/30/2018  . MMR 06/22/1982, 12/07/1998  . Td 12/07/1998  . Tdap 07/12/2006, 01/20/2013    Health Maintenance  Topic Date Due  . INFLUENZA VACCINE  03/07/2019  . PAP SMEAR-Modifier  06/30/2021  . TETANUS/TDAP  01/21/2023  . HIV Screening  Completed     Discussed health benefits of physical activity, and encouraged her to engage in regular exercise appropriate for her age and condition.    --------------------------------------------------------------------    Mar Daring, PA-C  [place]

## 2019-07-01 NOTE — Telephone Encounter (Signed)
Rx already sent to pharmacy.

## 2019-07-06 ENCOUNTER — Encounter: Payer: BC Managed Care – PPO | Admitting: Physician Assistant

## 2019-08-07 NOTE — L&D Delivery Note (Signed)
Delivery Note At 10:20 AM a viable and healthy female was delivered via Vaginal, Spontaneous (Presentation: LOA ).  APGAR: 9, 9; weight 6 lb 10 oz (3005 g).   Placenta status: Spontaneous, Intact.  Cord: 3 vessels with the following complications: None.  Cord pH: na  Anesthesia: None Episiotomy: Median Lacerations: 2nd degree Suture Repair: 2.0 vicryl rapide Est. Blood Loss (mL): 300  Mom to postpartum.  Baby to Couplet care / Skin to Skin.  Fareedah Mahler J 05/02/2020, 12:16 PM

## 2019-08-13 ENCOUNTER — Other Ambulatory Visit: Payer: Self-pay

## 2019-08-13 ENCOUNTER — Ambulatory Visit (INDEPENDENT_AMBULATORY_CARE_PROVIDER_SITE_OTHER): Payer: BC Managed Care – PPO | Admitting: Physician Assistant

## 2019-08-13 ENCOUNTER — Encounter: Payer: Self-pay | Admitting: Physician Assistant

## 2019-08-13 VITALS — BP 122/86 | HR 71 | Temp 96.9°F | Wt 145.4 lb

## 2019-08-13 DIAGNOSIS — Z23 Encounter for immunization: Secondary | ICD-10-CM

## 2019-08-13 DIAGNOSIS — Z1239 Encounter for other screening for malignant neoplasm of breast: Secondary | ICD-10-CM

## 2019-08-13 DIAGNOSIS — Z0000 Encounter for general adult medical examination without abnormal findings: Secondary | ICD-10-CM | POA: Diagnosis not present

## 2019-08-13 NOTE — Progress Notes (Signed)
     Patient: Erin Vance, Female    DOB: 10/12/1980, 38 y.o.   MRN: [id number] Visit Date: 08/13/2019  Today's Provider: [name], PA-C   No chief complaint on file.  Subjective:     Annual physical exam Erin Vance is a 38 y.o. female who presents today for health maintenance and complete physical. She feels well. She reports not exercising. She reports she is sleeping well.  Mother just passed on 07/28/19. -----------------------------------------------------------------   Review of Systems  Constitutional: Negative.   HENT: Negative.   Eyes: Negative.   Respiratory: Negative.   Cardiovascular: Negative.   Gastrointestinal: Negative.   Endocrine: Negative.   Genitourinary: Negative.   Musculoskeletal: Negative.   Skin: Negative.   Allergic/Immunologic: Negative.   Neurological: Negative.   Hematological: Negative.   Psychiatric/Behavioral: Negative.     Social History      She  reports that she has never smoked. She has never used smokeless tobacco. She reports current alcohol use. She reports that she does not use drugs.       Social History   Socioeconomic History  . Marital status: Widowed    Spouse name: Not on file  . Number of children: Not on file  . Years of education: Not on file  . Highest education level: Not on file  Occupational History  . Not on file  Tobacco Use  . Smoking status: Never Smoker  . Smokeless tobacco: Never Used  Substance and Sexual Activity  . Alcohol use: Yes    Alcohol/week: 0.0 standard drinks    Comment: occasionally  . Drug use: No  . Sexual activity: Yes  Other Topics Concern  . Not on file  Social History Narrative  . Not on file   Social Determinants of Health   Financial Resource Strain:   . Difficulty of Paying Living Expenses: Not on file  Food Insecurity:   . Worried About Running Out of Food in the Last Year: Not on file  . Ran Out of Food in the Last Year: Not on file    Transportation Needs:   . Lack of Transportation (Medical): Not on file  . Lack of Transportation (Non-Medical): Not on file  Physical Activity:   . Days of Exercise per Week: Not on file  . Minutes of Exercise per Session: Not on file  Stress:   . Feeling of Stress : Not on file  Social Connections:   . Frequency of Communication with Friends and Family: Not on file  . Frequency of Social Gatherings with Friends and Family: Not on file  . Attends Religious Services: Not on file  . Active Member of Clubs or Organizations: Not on file  . Attends Club or Organization Meetings: Not on file  . Marital Status: Not on file    Past Medical History:  Diagnosis Date  . Anxiety      Patient Active Problem List   Diagnosis Date Noted  . Grief reaction 02/23/2015  . Hemorrhoid 01/11/2015  . 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 NO PAST SURGERIES      Family History        Family Status  Relation Name Status  . Mother  Alive  . Father  Alive  . Sister 1 Alive  . MGM  Alive  . PGF  Deceased  . Sister 2 Alive  . Mat Uncle  Alive          Her family history includes Bell's palsy in her sister; Breast cancer in her maternal grandmother; Diabetes in her sister; Heart attack in her father and paternal grandfather; Hypertension in her mother; Kidney cancer (age of onset: 72) in her mother.      Allergies  Allergen Reactions  . Cephalexin      Current Outpatient Medications:  .  ALPRAZolam (XANAX) 0.25 MG tablet, Take 1 tablet (0.25 mg total) by mouth 2 (two) times daily as needed for anxiety., Disp: 30 tablet, Rfl: 0 .  fluocinonide cream (LIDEX) 0.05 %, Apply 1 application topically 3 (three) times daily. To insect bites, Disp: 60 g, Rfl: 0   Patient Care Team: Burnette, Jennifer M, PA-C as PCP - General (Physician Assistant)    Objective:    Vitals: There were no vitals taken for this  visit.  There were no vitals filed for this visit.   Physical Exam Vitals reviewed.  Constitutional:      General: She is not in acute distress.    Appearance: Normal appearance. She is well-developed and normal weight. She is not ill-appearing or diaphoretic.  HENT:     Head: Normocephalic and atraumatic.     Right Ear: Tympanic membrane, ear canal and external ear normal.     Left Ear: Tympanic membrane, ear canal and external ear normal.  Eyes:     General: No scleral icterus.       Right eye: No discharge.        Left eye: No discharge.     Extraocular Movements: Extraocular movements intact.     Conjunctiva/sclera: Conjunctivae normal.     Pupils: Pupils are equal, round, and reactive to light.  Neck:     Thyroid: No thyromegaly.     Vascular: No JVD.     Trachea: No tracheal deviation.  Cardiovascular:     Rate and Rhythm: Normal rate and regular rhythm.     Pulses: Normal pulses.     Heart sounds: Normal heart sounds. No murmur. No friction rub. No gallop.   Pulmonary:     Effort: Pulmonary effort is normal. No respiratory distress.     Breath sounds: Normal breath sounds. No wheezing or rales.  Chest:     Chest wall: No tenderness.  Abdominal:     General: Abdomen is flat. Bowel sounds are normal. There is no distension.     Palpations: Abdomen is soft. There is no mass.     Tenderness: There is no abdominal tenderness. There is no guarding or rebound.  Musculoskeletal:        General: No tenderness. Normal range of motion.     Cervical back: Normal range of motion and neck supple.     Right lower leg: No edema.     Left lower leg: No edema.  Lymphadenopathy:     Cervical: No cervical adenopathy.  Skin:    General: Skin is warm and dry.     Capillary Refill: Capillary refill takes less than 2 seconds.     Findings: No rash.  Neurological:     General: No focal deficit present.     Mental Status: She is alert and oriented to person, place, and time. Mental  status is at baseline.  Psychiatric:        Mood and Affect: Mood normal.        Behavior: Behavior normal.        Thought Content: Thought content normal.        Judgment: Judgment   normal.      Depression Screen PHQ 2/9 Scores 06/30/2018 06/26/2017 03/11/2015 01/26/2015  PHQ - 2 Score 0 1 0 0  PHQ- 9 Score 1 - - -       Assessment & Plan:     Routine Health Maintenance and Physical Exam  Exercise Activities and Dietary recommendations Goals   None     Immunization History  Administered Date(s) Administered  . DTaP 09/28/1980, 11/26/1980, 02/17/1981, 06/22/1982  . HPV Quadrivalent 07/12/2006, 09/19/2006, 01/22/2007  . Hepatitis B 10/27/1997, 11/29/1997, 08/17/1998  . IPV 09/28/1980, 11/26/1980, 06/22/1982, 03/04/1986  . Influenza Split 06/28/2012  . Influenza,inj,Quad PF,6+ Mos 04/20/2016, 06/30/2018  . MMR 06/22/1982, 12/07/1998  . Td 12/07/1998  . Tdap 07/12/2006, 01/20/2013    Health Maintenance  Topic Date Due  . INFLUENZA VACCINE  03/07/2019  . PAP SMEAR-Modifier  06/30/2021  . TETANUS/TDAP  01/21/2023  . HIV Screening  Completed     Discussed health benefits of physical activity, and encouraged her to engage in regular exercise appropriate for her age and condition.    1. Annual physical exam Normal physical exam today. Will check labs as below and f/u pending lab results. If labs are stable and WNL she will not need to have these rechecked for one year at her next annual physical exam. She is to call the office in the meantime if she has any acute issue, questions or concerns. - CBC with Differential - Comprehensive Metabolic Panel (CMET) - TSH - Lipid Profile - HgB A1c  2. Encounter for breast cancer screening using non-mammogram modality Had mammogram in March 2020 for possible abnormality and was normal.  3. Need for influenza vaccination Flu vaccine given today without complication. Patient sat upright for 15 minutes to check for adverse  reaction before being released. - Flu Vaccine QUAD 36+ mos PF IM (Fluarix & Fluzone Quad PF)  --------------------------------------------------------------------    [name], PA-C  [facility] 

## 2019-08-13 NOTE — Patient Instructions (Signed)
Health Maintenance, Female Adopting a healthy lifestyle and getting preventive care are important in promoting health and wellness. Ask your health care provider about:  The right schedule for you to have regular tests and exams.  Things you can do on your own to prevent diseases and keep yourself healthy. What should I know about diet, weight, and exercise? Eat a healthy diet   Eat a diet that includes plenty of vegetables, fruits, low-fat dairy products, and lean protein.  Do not eat a lot of foods that are high in solid fats, added sugars, or sodium. Maintain a healthy weight Body mass index (BMI) is used to identify weight problems. It estimates body fat based on height and weight. Your health care provider can help determine your BMI and help you achieve or maintain a healthy weight. Get regular exercise Get regular exercise. This is one of the most important things you can do for your health. Most adults should:  Exercise for at least 150 minutes each week. The exercise should increase your heart rate and make you sweat (moderate-intensity exercise).  Do strengthening exercises at least twice a week. This is in addition to the moderate-intensity exercise.  Spend less time sitting. Even light physical activity can be beneficial. Watch cholesterol and blood lipids Have your blood tested for lipids and cholesterol at 39 years of age, then have this test every 5 years. Have your cholesterol levels checked more often if:  Your lipid or cholesterol levels are high.  You are older than 40 years of age.  You are at high risk for heart disease. What should I know about cancer screening? Depending on your health history and family history, you may need to have cancer screening at various ages. This may include screening for:  Breast cancer.  Cervical cancer.  Colorectal cancer.  Skin cancer.  Lung cancer. What should I know about heart disease, diabetes, and high blood  pressure? Blood pressure and heart disease  High blood pressure causes heart disease and increases the risk of stroke. This is more likely to develop in people who have high blood pressure readings, are of African descent, or are overweight.  Have your blood pressure checked: ? Every 3-5 years if you are 18-39 years of age. ? Every year if you are 40 years old or older. Diabetes Have regular diabetes screenings. This checks your fasting blood sugar level. Have the screening done:  Once every three years after age 40 if you are at a normal weight and have a low risk for diabetes.  More often and at a younger age if you are overweight or have a high risk for diabetes. What should I know about preventing infection? Hepatitis B If you have a higher risk for hepatitis B, you should be screened for this virus. Talk with your health care provider to find out if you are at risk for hepatitis B infection. Hepatitis C Testing is recommended for:  Everyone born from 1945 through 1965.  Anyone with known risk factors for hepatitis C. Sexually transmitted infections (STIs)  Get screened for STIs, including gonorrhea and chlamydia, if: ? You are sexually active and are younger than 39 years of age. ? You are older than 39 years of age and your health care provider tells you that you are at risk for this type of infection. ? Your sexual activity has changed since you were last screened, and you are at increased risk for chlamydia or gonorrhea. Ask your health care provider if   you are at risk.  Ask your health care provider about whether you are at high risk for HIV. Your health care provider may recommend a prescription medicine to help prevent HIV infection. If you choose to take medicine to prevent HIV, you should first get tested for HIV. You should then be tested every 3 months for as long as you are taking the medicine. Pregnancy  If you are about to stop having your period (premenopausal) and  you may become pregnant, seek counseling before you get pregnant.  Take 400 to 800 micrograms (mcg) of folic acid every day if you become pregnant.  Ask for birth control (contraception) if you want to prevent pregnancy. Osteoporosis and menopause Osteoporosis is a disease in which the bones lose minerals and strength with aging. This can result in bone fractures. If you are 65 years old or older, or if you are at risk for osteoporosis and fractures, ask your health care provider if you should:  Be screened for bone loss.  Take a calcium or vitamin D supplement to lower your risk of fractures.  Be given hormone replacement therapy (HRT) to treat symptoms of menopause. Follow these instructions at home: Lifestyle  Do not use any products that contain nicotine or tobacco, such as cigarettes, e-cigarettes, and chewing tobacco. If you need help quitting, ask your health care provider.  Do not use street drugs.  Do not share needles.  Ask your health care provider for help if you need support or information about quitting drugs. Alcohol use  Do not drink alcohol if: ? Your health care provider tells you not to drink. ? You are pregnant, may be pregnant, or are planning to become pregnant.  If you drink alcohol: ? Limit how much you use to 0-1 drink a day. ? Limit intake if you are breastfeeding.  Be aware of how much alcohol is in your drink. In the [place] one drink equals one 12 oz bottle of beer (355 mL), one 5 oz glass of wine (148 mL), or one 1 oz glass of hard liquor (44 mL). General instructions  Schedule regular health, dental, and eye exams.  Stay current with your vaccines.  Tell your health care provider if: ? You often feel depressed. ? You have ever been abused or do not feel safe at home. Summary  Adopting a healthy lifestyle and getting preventive care are important in promoting health and wellness.  Follow your health care provider's instructions about healthy  diet, exercising, and getting tested or screened for diseases.  Follow your health care provider's instructions on monitoring your cholesterol and blood pressure. This information is not intended to replace advice given to you by your health care provider. Make sure you discuss any questions you have with your health care provider. Document Revised: 07/16/2018 Document Reviewed: 07/16/2018 Elsevier Patient Education  2020 Elsevier Inc.  

## 2019-08-14 ENCOUNTER — Telehealth: Payer: Self-pay

## 2019-08-14 LAB — CBC WITH DIFFERENTIAL/PLATELET
Basophils Absolute: 0.1 10*3/uL (ref 0.0–0.2)
Basos: 1 %
EOS (ABSOLUTE): 0.4 10*3/uL (ref 0.0–0.4)
Eos: 7 %
Hematocrit: 44.4 % (ref 34.0–46.6)
Hemoglobin: 14.6 g/dL (ref 11.1–15.9)
Immature Grans (Abs): 0 10*3/uL (ref 0.0–0.1)
Immature Granulocytes: 0 %
Lymphocytes Absolute: 1.2 10*3/uL (ref 0.7–3.1)
Lymphs: 21 %
MCH: 29.6 pg (ref 26.6–33.0)
MCHC: 32.9 g/dL (ref 31.5–35.7)
MCV: 90 fL (ref 79–97)
Monocytes Absolute: 0.5 10*3/uL (ref 0.1–0.9)
Monocytes: 8 %
Neutrophils Absolute: 3.6 10*3/uL (ref 1.4–7.0)
Neutrophils: 63 %
Platelets: 241 10*3/uL (ref 150–450)
RBC: 4.93 x10E6/uL (ref 3.77–5.28)
RDW: 12 % (ref 11.7–15.4)
WBC: 5.8 10*3/uL (ref 3.4–10.8)

## 2019-08-14 LAB — COMPREHENSIVE METABOLIC PANEL
ALT: 15 IU/L (ref 0–32)
AST: 16 IU/L (ref 0–40)
Albumin/Globulin Ratio: 2 (ref 1.2–2.2)
Albumin: 4.3 g/dL (ref 3.8–4.8)
Alkaline Phosphatase: 49 IU/L (ref 39–117)
BUN/Creatinine Ratio: 13 (ref 9–23)
BUN: 11 mg/dL (ref 6–20)
Bilirubin Total: 0.5 mg/dL (ref 0.0–1.2)
CO2: 24 mmol/L (ref 20–29)
Calcium: 9.4 mg/dL (ref 8.7–10.2)
Chloride: 101 mmol/L (ref 96–106)
Creatinine, Ser: 0.87 mg/dL (ref 0.57–1.00)
GFR calc Af Amer: 98 mL/min/{1.73_m2} (ref >59)
GFR calc non Af Amer: 85 mL/min/{1.73_m2} (ref >59)
Globulin, Total: 2.2 g/dL (ref 1.5–4.5)
Glucose: 85 mg/dL (ref 65–99)
Potassium: 4.1 mmol/L (ref 3.5–5.2)
Sodium: 139 mmol/L (ref 134–144)
Total Protein: 6.5 g/dL (ref 6.0–8.5)

## 2019-08-14 LAB — LIPID PANEL
Chol/HDL Ratio: 2 ratio (ref 0.0–4.4)
Cholesterol, Total: 163 mg/dL (ref 100–199)
HDL: 81 mg/dL (ref >39)
LDL Chol Calc (NIH): 68 mg/dL (ref 0–99)
Triglycerides: 74 mg/dL (ref 0–149)
VLDL Cholesterol Cal: 14 mg/dL (ref 5–40)

## 2019-08-14 LAB — HEMOGLOBIN A1C
Est. average glucose Bld gHb Est-mCnc: 97 mg/dL
Hgb A1c MFr Bld: 5 % (ref 4.8–5.6)

## 2019-08-14 LAB — TSH: TSH: 1.55 u[IU]/mL (ref 0.450–4.500)

## 2019-08-14 NOTE — Telephone Encounter (Signed)
  Result Notes and Comments to Patient Comment seen by patient Lenon Curt on 08/14/2019 11:42 AM EST

## 2019-08-14 NOTE — Telephone Encounter (Signed)
-----   Message from Margaretann Loveless, [place] sent at 08/14/2019 11:40 AM EST ----- Blood count is normal. Kidney and liver function are normal. Sodium, potassium and calcium are normal. Thyroid is normal. Cholesterol is normal. A1c/sugar is normal.

## 2019-08-25 ENCOUNTER — Encounter: Payer: Self-pay | Admitting: Physician Assistant

## 2019-08-31 ENCOUNTER — Ambulatory Visit: Payer: BC Managed Care – PPO | Admitting: Physician Assistant

## 2019-08-31 ENCOUNTER — Encounter: Payer: Self-pay | Admitting: Physician Assistant

## 2019-08-31 ENCOUNTER — Other Ambulatory Visit: Payer: Self-pay

## 2019-08-31 VITALS — BP 114/77 | HR 81 | Temp 97.3°F | Resp 16 | Wt 147.7 lb

## 2019-08-31 DIAGNOSIS — O09291 Supervision of pregnancy with other poor reproductive or obstetric history, first trimester: Secondary | ICD-10-CM

## 2019-08-31 DIAGNOSIS — Z3201 Encounter for pregnancy test, result positive: Secondary | ICD-10-CM | POA: Diagnosis not present

## 2019-08-31 DIAGNOSIS — O0991 Supervision of high risk pregnancy, unspecified, first trimester: Secondary | ICD-10-CM | POA: Diagnosis not present

## 2019-08-31 DIAGNOSIS — Z3200 Encounter for pregnancy test, result unknown: Secondary | ICD-10-CM | POA: Diagnosis not present

## 2019-08-31 DIAGNOSIS — Z9889 Other specified postprocedural states: Secondary | ICD-10-CM

## 2019-08-31 LAB — POCT URINE PREGNANCY: Preg Test, Ur: POSITIVE — AB

## 2019-08-31 NOTE — Patient Instructions (Signed)
Pregnancy After Age 39 Women who become pregnant after the age of 39 have a higher risk for certain problems during pregnancy. This is because older women may already have health problems before becoming pregnant. Older women who are healthy before pregnancy may still develop problems during pregnancy. These problems may affect the mother, the unborn baby (fetus), or both. What are the risks for me? If you are over age 39 and you want to become pregnant or are pregnant, you may have a higher risk of:  Not being able to get pregnant (infertility).  Going into labor early (preterm labor).  Needing surgical delivery of your baby (cesarean delivery, or C-section).  Having high blood pressure (hypertension).  Having complications during pregnancy, such as high blood pressure and other symptoms (preeclampsia).  Having diabetes during pregnancy (gestational diabetes).  Being pregnant with more than one baby.  Loss of the unborn baby before 20 weeks (miscarriage) or after 20 weeks of pregnancy (stillbirth). What are the risks for my baby? Babies born to women over the age of 39 have a higher risk for:  Being born early (prematurity).  Low birth weight, which is less than 5 lb, 8 oz (2.5 kg).  Birth defects, such as Down syndrome and cleft palate.  Health complications, including problems with growth and development. How is prenatal care different for women over age 39? All women should see their health care provider before they try to become pregnant. This is especially important for women over the age of 39. Tell your health care provider about:  Any health problems you have.  Any medicines you take.  Any family history of health problems or chromosome-related defects.  Any problems you have had with past pregnancies or deliveries. If you are over age 39 and you plan to become pregnant:  Start taking a daily multivitamin a month or more before you try to get pregnant. Your  multivitamin should contain 400 mcg (micrograms) of folic acid. If you are over age 39 and pregnant, make sure you:  Keep taking your multivitamin unless your health care provider tells you not to take it.  Keep all prenatal visits as told by your health care provider. This is important.  Have ultrasounds regularly throughout your pregnancy to check for problems.  Talk with your health care provider about other prenatal screening tests that you may need. What additional prenatal tests are needed? Screening tests show whether your baby has a higher risk for birth defects than other babies. Screening tests include:  Ultrasound tests to look for markers that indicate a risk for birth defects.  Maternal blood screening. These are blood tests that measure certain substances in your blood to determine your baby's risk for defects. Screening tests do not show whether your baby has or does not have defects. They only show your baby's risk for certain defects. If your screening tests show that risk factors are present, you may need tests to confirm the defect (diagnostic testing). These tests may include:  Chorionic villus sampling. For this procedure, a tissue sample is taken from the organ that forms in your uterus to nourish your baby (placenta). The sample is removed through your cervix or abdomen and tested.  Amniocentesis. For this procedure, a small amount of the fluid that surrounds the baby in the uterus (amniotic fluid) is removed and tested. What can I do to stay healthy during my pregnancy? Staying healthy during pregnancy can help you and your baby to have a lower risk for   problems during pregnancy, during delivery, or both. Talk with your health care provider for specific instructions about staying healthy during your pregnancy. Nutrition   At each meal, eat a variety of foods from each of the five food groups. These groups include: ? Proteins such as lean meats, poultry, fish that is  low in fat, beans, eggs, and nuts. ? Vegetables such as leafy greens, raw and cooked vegetables, and vegetable juice. ? Fruits that are fresh, frozen, or canned, or 100% fruit juice. ? Dairy products such as low-fat yogurt, cheese, and milk. ? Whole grains including rice, cereal, pasta, and bread.  Talk with your health care provider about how much food in each group is right for you.  Follow instructions from your health care provider about eating and drinking restrictions during pregnancy. ? Do not eat raw eggs, raw meat, or raw fish or seafood. ? Do not eat any fish that contains high amounts of mercury, such as swordfish or mackerel.  Drink 6-8 or more glasses of water a day. You should drink enough fluid to keep your urine pale yellow. Managing weight gain  Ask your health care provider how much weight gain is healthy during pregnancy.  Stay at a healthy weight. If needed, work with your health care provider to lose weight safely. Activity  Exercise regularly, as directed by your health care provider. Ask your health care provider what forms of exercise are safe for you. General instructions  Do not use any products that contain nicotine or tobacco, such as cigarettes and e-cigarettes. If you need help quitting, ask your health care provider.  Do not drink alcohol, use drugs, or abuse prescription medicine.  Take over-the-counter and prescription medicines only as told by your health care provider.  Do not use hot tubs, steam rooms, or saunas.  Talk with your health care provider about your risk of exposure to harmful environmental conditions. This includes exposure to chemicals, radiation, cleaning products, and cat feces. Follow advice from your health care provider about how to limit your exposure. Summary  Women who become pregnant after the age of 39 have a higher risk for complications during pregnancy.  Problems may affect the mother, the unborn baby (fetus), or  both.  All women should see their health care provider before they try to become pregnant. This is especially important for women over the age of 39.  Staying healthy during pregnancy can help both you and your baby to have a lower risk for some of the problems that can happen during pregnancy, during delivery, or both. This information is not intended to replace advice given to you by your health care provider. Make sure you discuss any questions you have with your health care provider. Document Revised: 11/14/2018 Document Reviewed: 11/12/2016 Elsevier Patient Education  2020 Elsevier Inc.  

## 2019-08-31 NOTE — Telephone Encounter (Signed)
Maralyn Sago,  See the below [employer]. After placing referral patient prefers Dr. Billy Coast.

## 2019-08-31 NOTE — Progress Notes (Signed)
Patient: Erin Vance Female    DOB: 13-Dec-1980   39 y.o.   MRN: [id number] Visit Date: 08/31/2019  Today's Provider: Mar Daring, PA-C   Chief Complaint  Patient presents with  . Possible Pregnancy   Subjective:     HPI  Patient here with c/o possibility of being pregnant. She wants a pregnancy test. Patient has done some pregnancy test at home and have been positive. Last menstrual cycle was 07/28/2019.   She does have history of an elective abortion approx 3 years ago and had a spontaneous vaginal miscarriage 5 months ago. Pregnancy is desired.    Allergies  Allergen Reactions  . Cephalexin      Current Outpatient Medications:  .  ALPRAZolam (XANAX) 0.25 MG tablet, Take 1 tablet (0.25 mg total) by mouth 2 (two) times daily as needed for anxiety., Disp: 30 tablet, Rfl: 0 .  fluocinonide cream (LIDEX) 4.96 %, Apply 1 application topically 3 (three) times daily. To insect bites (Patient not taking: Reported on 08/31/2019), Disp: 60 g, Rfl: 0  Review of Systems  Constitutional: Negative.   Respiratory: Negative.   Cardiovascular: Negative.   Gastrointestinal: Negative.   Genitourinary: Positive for menstrual problem.  Neurological: Negative.     Social History   Tobacco Use  . Smoking status: Never Smoker  . Smokeless tobacco: Never Used  Substance Use Topics  . Alcohol use: Yes    Alcohol/week: 0.0 standard drinks    Comment: occasionally      Objective:   BP 114/77 (BP Location: Left Arm, Patient Position: Sitting, Cuff Size: Large)   Pulse 81   Temp (!) 97.3 F (36.3 C) (Other (Comment))   Resp 16   Wt 147 lb 11.2 oz (67 kg)   LMP 07/28/2019   BMI 27.91 kg/m  Vitals:   08/31/19 0748  BP: 114/77  Pulse: 81  Resp: 16  Temp: (!) 97.3 F (36.3 C)  TempSrc: Other (Comment)  Weight: 147 lb 11.2 oz (67 kg)  Body mass index is 27.91 kg/m.   Physical Exam Vitals reviewed.  Constitutional:      General: She is not in acute  distress.    Appearance: Normal appearance. She is well-developed. She is not ill-appearing.  HENT:     Head: Normocephalic and atraumatic.  Pulmonary:     Effort: Pulmonary effort is normal. No respiratory distress.  Musculoskeletal:     Cervical back: Normal range of motion and neck supple.  Neurological:     Mental Status: She is alert.  Psychiatric:        Mood and Affect: Mood normal.        Behavior: Behavior normal.        Thought Content: Thought content normal.        Judgment: Judgment normal.      Results for orders placed or performed in visit on 08/31/19  POCT urine pregnancy  Result Value Ref Range   Preg Test, Ur Positive (A) Negative       Assessment & Plan    1. Possible pregnancy Positive urine today. BetaHCG collected today. Referral placed to OB as below.  - Beta HCG, Quant - Ambulatory referral to Obstetrics / Gynecology  2. High-risk pregnancy in first trimester See above medical treatment plan. - Ambulatory referral to Obstetrics / Gynecology  3. Positive pregnancy test See above medical treatment plan. - Ambulatory referral to Obstetrics / Gynecology  4. History of miscarriage, currently pregnant, first trimester  5 months ago spontaneous. - Ambulatory referral to Obstetrics / Gynecology  5. History of elective abortion 3 to 4 years ago.  - Ambulatory referral to Obstetrics / Gynecology    Margaretann Loveless, PA-C  [facility]

## 2019-09-01 LAB — BETA HCG QUANT (REF LAB): hCG Quant: 1186 m[IU]/mL

## 2019-09-02 ENCOUNTER — Encounter: Payer: Self-pay | Admitting: Physician Assistant

## 2019-09-02 ENCOUNTER — Telehealth: Payer: Self-pay

## 2019-09-02 NOTE — Telephone Encounter (Signed)
Can we fax her betaHCG to [facility]? She has the fax in her mychart message.

## 2019-09-02 NOTE — Telephone Encounter (Signed)
Copied from CRM [id number]. Topic: General - Other >> Sep 02, 2019  2:18 PM Wyonia Hough E wrote: Reason for CRM: [facility] and infertility / Fax# [phone]/ needs blood test from Monday 1.25.21 sent over to them / please advise Pt if she needs to come in and sign release or if this can be faxed to the [facility]

## 2019-09-02 NOTE — Telephone Encounter (Signed)
This can be faxed for continuation of care.   See mychart message on same issue

## 2019-09-02 NOTE — Telephone Encounter (Signed)
Labs faxed to [facility]   Thanks,   Vernona Rieger

## 2019-09-02 NOTE — Telephone Encounter (Signed)
Result Communications   Result Notes and Comments to Patient Comment seen by patient Erin Vance on 09/02/2019 2:20 PM EST

## 2019-09-02 NOTE — Telephone Encounter (Signed)
-----   Message from Margaretann Loveless, [place] sent at 09/02/2019  2:18 PM EST ----- HCG is showing you are 5-[redacted]  weeks along :)

## 2019-09-02 NOTE — Telephone Encounter (Signed)
FYI

## 2019-09-02 NOTE — Telephone Encounter (Signed)
Copied from CRM [id number]. Topic: Referral - Status >> Sep 02, 2019  3:31 PM Randol Kern wrote: Reason for CRM: Marchelle Folks called from [facility] stating that they are full and are no longer accepting new patients    09/02/2019. 3:46pm Sarah C. notified of this message. She states she will work on referring patient to a different ob/ GYN.---------------R. Deretha Emory, CMA

## 2019-09-29 LAB — OB RESULTS CONSOLE RUBELLA ANTIBODY, IGM: Rubella: IMMUNE

## 2019-09-29 LAB — OB RESULTS CONSOLE RPR: RPR: NONREACTIVE

## 2019-09-29 LAB — OB RESULTS CONSOLE HEPATITIS B SURFACE ANTIGEN: Hepatitis B Surface Ag: NEGATIVE

## 2019-09-29 LAB — OB RESULTS CONSOLE ANTIBODY SCREEN: Antibody Screen: NEGATIVE

## 2019-09-29 LAB — OB RESULTS CONSOLE ABO/RH: RH Type: POSITIVE

## 2019-09-29 LAB — OB RESULTS CONSOLE HIV ANTIBODY (ROUTINE TESTING): HIV: NONREACTIVE

## 2020-04-08 LAB — OB RESULTS CONSOLE GBS: GBS: POSITIVE

## 2020-04-26 ENCOUNTER — Telehealth (HOSPITAL_COMMUNITY): Payer: Self-pay | Admitting: *Deleted

## 2020-04-26 ENCOUNTER — Other Ambulatory Visit: Payer: Self-pay | Admitting: Obstetrics and Gynecology

## 2020-04-26 ENCOUNTER — Encounter (HOSPITAL_COMMUNITY): Payer: Self-pay | Admitting: *Deleted

## 2020-04-26 NOTE — Telephone Encounter (Signed)
Preadmission screen  

## 2020-04-30 ENCOUNTER — Other Ambulatory Visit (HOSPITAL_COMMUNITY)
Admission: RE | Admit: 2020-04-30 | Discharge: 2020-04-30 | Disposition: A | Discharge status: Home / Self Care | Payer: BC Managed Care – PPO | Source: Ambulatory Visit | Attending: Obstetrics and Gynecology | Admitting: Obstetrics and Gynecology

## 2020-04-30 DIAGNOSIS — Z20822 Contact with and (suspected) exposure to covid-19: Secondary | ICD-10-CM | POA: Insufficient documentation

## 2020-04-30 DIAGNOSIS — Z01812 Encounter for preprocedural laboratory examination: Secondary | ICD-10-CM | POA: Insufficient documentation

## 2020-04-30 LAB — SARS CORONAVIRUS 2 (TAT 6-24 HRS): SARS Coronavirus 2: NEGATIVE

## 2020-05-02 ENCOUNTER — Encounter (HOSPITAL_COMMUNITY): Payer: Self-pay | Admitting: Obstetrics and Gynecology

## 2020-05-02 ENCOUNTER — Inpatient Hospital Stay (HOSPITAL_COMMUNITY)
Admission: AD | Admit: 2020-05-02 | Discharge: 2020-05-04 | DRG: 807 | Disposition: A | Discharge status: Home / Self Care | Payer: BC Managed Care – PPO | Attending: Obstetrics and Gynecology | Admitting: Obstetrics and Gynecology

## 2020-05-02 ENCOUNTER — Encounter (HOSPITAL_COMMUNITY): Payer: Self-pay | Admitting: Anesthesiology

## 2020-05-02 ENCOUNTER — Inpatient Hospital Stay (HOSPITAL_COMMUNITY): Payer: BC Managed Care – PPO

## 2020-05-02 DIAGNOSIS — O99824 Streptococcus B carrier state complicating childbirth: Secondary | ICD-10-CM | POA: Diagnosis present

## 2020-05-02 DIAGNOSIS — O365930 Maternal care for other known or suspected poor fetal growth, third trimester, not applicable or unspecified: Secondary | ICD-10-CM | POA: Diagnosis present

## 2020-05-02 DIAGNOSIS — Z3490 Encounter for supervision of normal pregnancy, unspecified, unspecified trimester: Secondary | ICD-10-CM | POA: Diagnosis present

## 2020-05-02 DIAGNOSIS — Z3A40 40 weeks gestation of pregnancy: Secondary | ICD-10-CM

## 2020-05-02 DIAGNOSIS — Z20822 Contact with and (suspected) exposure to covid-19: Secondary | ICD-10-CM | POA: Diagnosis present

## 2020-05-02 LAB — CBC
HCT: 38.2 % (ref 36.0–46.0)
Hemoglobin: 12.8 g/dL (ref 12.0–15.0)
MCH: 29.7 pg (ref 26.0–34.0)
MCHC: 33.5 g/dL (ref 30.0–36.0)
MCV: 88.6 fL (ref 80.0–100.0)
Platelets: 196 10*3/uL (ref 150–400)
RBC: 4.31 MIL/uL (ref 3.87–5.11)
RDW: 12.8 % (ref 11.5–15.5)
WBC: 8 10*3/uL (ref 4.0–10.5)
nRBC: 0 % (ref 0.0–0.2)

## 2020-05-02 LAB — TYPE AND SCREEN
ABO/RH(D): O POS
Antibody Screen: NEGATIVE

## 2020-05-02 LAB — RPR: RPR Ser Ql: NONREACTIVE

## 2020-05-02 MED ORDER — LIDOCAINE HCL (PF) 1 % IJ SOLN
30.0000 mL | INTRAMUSCULAR | Status: AC | PRN
  Administered 2020-05-02: 30 mL via SUBCUTANEOUS
  Filled 2020-05-02: qty 30

## 2020-05-02 MED ORDER — LACTATED RINGERS IV SOLN: 500.0000 mL | INTRAVENOUS | Status: DC | PRN

## 2020-05-02 MED ORDER — EPHEDRINE 5 MG/ML INJ: 10.0000 mg | INTRAVENOUS | Status: DC | PRN

## 2020-05-02 MED ORDER — WITCH HAZEL-GLYCERIN EX PADS: 1.0000 "application " | MEDICATED_PAD | CUTANEOUS | Status: DC | PRN

## 2020-05-02 MED ORDER — SOD CITRATE-CITRIC ACID 500-334 MG/5ML PO SOLN: 30.0000 mL | ORAL | Status: DC | PRN

## 2020-05-02 MED ORDER — IBUPROFEN 600 MG PO TABS
600.0000 mg | ORAL_TABLET | Freq: Four times a day (QID) | ORAL | Status: DC
  Administered 2020-05-02 – 2020-05-04 (×8): 600 mg via ORAL
  Filled 2020-05-02 (×8): qty 1

## 2020-05-02 MED ORDER — OXYTOCIN BOLUS FROM INFUSION: 333.0000 mL | Freq: Once | INTRAVENOUS | Status: DC

## 2020-05-02 MED ORDER — ACETAMINOPHEN 325 MG PO TABS: 650.0000 mg | ORAL_TABLET | ORAL | Status: DC | PRN

## 2020-05-02 MED ORDER — METHYLERGONOVINE MALEATE 0.2 MG PO TABS: 0.2000 mg | ORAL_TABLET | ORAL | Status: DC | PRN

## 2020-05-02 MED ORDER — PHENYLEPHRINE 40 MCG/ML (10ML) SYRINGE FOR IV PUSH (FOR BLOOD PRESSURE SUPPORT): 80.0000 ug | PREFILLED_SYRINGE | INTRAVENOUS | Status: DC | PRN

## 2020-05-02 MED ORDER — COCONUT OIL OIL: 1.0000 "application " | TOPICAL_OIL | Status: DC | PRN

## 2020-05-02 MED ORDER — PENICILLIN G POT IN DEXTROSE 60000 UNIT/ML IV SOLN
3.0000 10*6.[IU] | INTRAVENOUS | Status: DC
  Administered 2020-05-02: 3 10*6.[IU] via INTRAVENOUS
  Filled 2020-05-02: qty 50

## 2020-05-02 MED ORDER — SENNOSIDES-DOCUSATE SODIUM 8.6-50 MG PO TABS
2.0000 | ORAL_TABLET | ORAL | Status: DC
  Administered 2020-05-03 (×2): 2 via ORAL
  Filled 2020-05-02 (×2): qty 2

## 2020-05-02 MED ORDER — OXYTOCIN-SODIUM CHLORIDE 30-0.9 UT/500ML-% IV SOLN
2.5000 [IU]/h | INTRAVENOUS | Status: DC
  Filled 2020-05-02: qty 500

## 2020-05-02 MED ORDER — LACTATED RINGERS IV SOLN: INTRAVENOUS | Status: DC

## 2020-05-02 MED ORDER — ONDANSETRON HCL 4 MG PO TABS: 4.0000 mg | ORAL_TABLET | ORAL | Status: DC | PRN

## 2020-05-02 MED ORDER — ONDANSETRON HCL 4 MG/2ML IJ SOLN: 4.0000 mg | INTRAMUSCULAR | Status: DC | PRN

## 2020-05-02 MED ORDER — FENTANYL CITRATE (PF) 100 MCG/2ML IJ SOLN
100.0000 ug | INTRAMUSCULAR | Status: DC | PRN
  Administered 2020-05-02: 100 ug via INTRAVENOUS

## 2020-05-02 MED ORDER — LACTATED RINGERS IV SOLN
500.0000 mL | Freq: Once | INTRAVENOUS | Status: AC
  Administered 2020-05-02: 500 mL via INTRAVENOUS

## 2020-05-02 MED ORDER — FENTANYL CITRATE (PF) 100 MCG/2ML IJ SOLN
INTRAMUSCULAR | Status: AC
  Filled 2020-05-02: qty 2

## 2020-05-02 MED ORDER — ZOLPIDEM TARTRATE 5 MG PO TABS: 5.0000 mg | ORAL_TABLET | Freq: Every evening | ORAL | Status: DC | PRN

## 2020-05-02 MED ORDER — SODIUM CHLORIDE 0.9 % IV SOLN
5.0000 10*6.[IU] | Freq: Once | INTRAVENOUS | Status: AC
  Administered 2020-05-02: 5 10*6.[IU] via INTRAVENOUS
  Filled 2020-05-02: qty 5

## 2020-05-02 MED ORDER — MISOPROSTOL 25 MCG QUARTER TABLET
25.0000 ug | ORAL_TABLET | ORAL | Status: DC | PRN
  Administered 2020-05-02 (×2): 25 ug via VAGINAL
  Filled 2020-05-02 (×2): qty 1

## 2020-05-02 MED ORDER — DIPHENHYDRAMINE HCL 25 MG PO CAPS: 25.0000 mg | ORAL_CAPSULE | Freq: Four times a day (QID) | ORAL | Status: DC | PRN

## 2020-05-02 MED ORDER — TERBUTALINE SULFATE 1 MG/ML IJ SOLN: 0.2500 mg | Freq: Once | INTRAMUSCULAR | Status: DC | PRN

## 2020-05-02 MED ORDER — PRENATAL MULTIVITAMIN CH
1.0000 | ORAL_TABLET | Freq: Every day | ORAL | Status: DC
  Administered 2020-05-03: 1 via ORAL
  Filled 2020-05-02 (×2): qty 1

## 2020-05-02 MED ORDER — ONDANSETRON HCL 4 MG/2ML IJ SOLN
4.0000 mg | Freq: Four times a day (QID) | INTRAMUSCULAR | Status: DC | PRN
  Administered 2020-05-02: 4 mg via INTRAVENOUS
  Filled 2020-05-02: qty 2

## 2020-05-02 MED ORDER — FENTANYL-BUPIVACAINE-NACL 0.5-0.125-0.9 MG/250ML-% EP SOLN
12.0000 mL/h | EPIDURAL | Status: DC | PRN
  Filled 2020-05-02: qty 250

## 2020-05-02 MED ORDER — METHYLERGONOVINE MALEATE 0.2 MG/ML IJ SOLN: 0.2000 mg | INTRAMUSCULAR | Status: DC | PRN

## 2020-05-02 MED ORDER — TETANUS-DIPHTH-ACELL PERTUSSIS 5-2.5-18.5 LF-MCG/0.5 IM SUSP: 0.5000 mL | Freq: Once | INTRAMUSCULAR | Status: DC

## 2020-05-02 MED ORDER — BENZOCAINE-MENTHOL 20-0.5 % EX AERO
1.0000 "application " | INHALATION_SPRAY | CUTANEOUS | Status: DC | PRN
  Administered 2020-05-02: 1 via TOPICAL
  Filled 2020-05-02: qty 56

## 2020-05-02 MED ORDER — DIPHENHYDRAMINE HCL 50 MG/ML IJ SOLN: 12.5000 mg | INTRAMUSCULAR | Status: DC | PRN

## 2020-05-02 MED ORDER — DIBUCAINE (PERIANAL) 1 % EX OINT: 1.0000 "application " | TOPICAL_OINTMENT | CUTANEOUS | Status: DC | PRN

## 2020-05-02 MED ORDER — SIMETHICONE 80 MG PO CHEW: 80.0000 mg | CHEWABLE_TABLET | ORAL | Status: DC | PRN

## 2020-05-02 NOTE — Lactation Note (Signed)
This note was copied from a baby's chart. Lactation Consultation Note  Patient Name: Erin Vance [id number] Date: 05/02/2020 Reason for consult: Follow-up assessment;1st time breastfeeding;Term;Difficult latch  Baby is 5 hours old .  Baby awake and hungry, rooting,and fussy. LC assisted grandmother to change a large stool mec stool.  LC placed baby STS on the right breast / football / and the baby was on and off few sucks each time she latched.  LC assisted mom to switch the baby to the other breast left breast/ had mom massage,LC assisted with pre - pumping to prime the milk ducts and elongate the short shaft nipple and baby latched easier and fed for 8 mins with increased swallows. Baby released on her own.  LC instructed  Mom on the use shells between feedings except when sleeping.  And hand pump and the #24 F.  LC provided the [facility] pamphlet with phone numbers.    Maternal Data Has patient been taught Hand Expression?: Yes  Feeding Feeding Type: Breast Fed  LATCH Score Latch: Grasps breast easily, tongue down, lips flanged, rhythmical sucking.  Audible Swallowing: Spontaneous and intermittent  Type of Nipple: Everted at rest and after stimulation  Comfort (Breast/Nipple): Soft / non-tender  Hold (Positioning): Assistance needed to correctly position infant at breast and maintain latch.  LATCH Score: 9  Interventions Interventions: Breast feeding basics reviewed;Assisted with latch;Skin to skin;Breast massage;Hand express;Pre-pump if needed;Reverse pressure;Breast compression;Adjust position;Support pillows;Position options  Lactation Tools Discussed/Used Tools: Shells;Pump;Flanges Flange Size: 24 Shell Type: Inverted Breast pump type: Manual WIC Program: No   Consult Status Consult Status: Follow-up Date: 05/03/20 Follow-up type: In-patient    Matilde Sprang [name] 05/02/2020, 3:28 PM

## 2020-05-02 NOTE — Anesthesia Preprocedure Evaluation (Deleted)

## 2020-05-02 NOTE — H&P (Signed)
Erin Vance is a 39 y.o. female presenting for IOL for probable SGA. OB History    Gravida  3   Para      Term      Preterm      AB  2   Living        SAB  1   TAB  1   Ectopic      Multiple      Live Births             Past Medical History:  Diagnosis Date  . Anxiety    Past Surgical History:  Procedure Laterality Date  . NO PAST SURGERIES     Family History: family history includes Bell's palsy in her sister; Breast cancer in her paternal grandmother; Congestive Heart Failure in her sister; Diabetes in her sister; Heart attack in her father and paternal grandfather; Hypertension in her mother; Kidney cancer (age of onset: 23) in her mother; Valvular heart disease in her mother. Social History:  reports that she has never smoked. She has never used smokeless tobacco. She reports current alcohol use. She reports that she does not use drugs.     Maternal Diabetes: No Genetic Screening: Declined Maternal Ultrasounds/Referrals: Normal Fetal Ultrasounds or other Referrals:  None Maternal Substance Abuse:  No Significant Maternal Medications:  None Significant Maternal Lab Results:  Group B Strep negative Other Comments:  None  Review of Systems  Constitutional: Negative.   All other systems reviewed and are negative.  Maternal Medical History:  Reason for admission: Contractions.   Contractions: Onset was less than 1 hour ago.   Frequency: rare.   Perceived severity is mild.    Fetal activity: Perceived fetal activity is normal.   Last perceived fetal movement was within the past hour.    Prenatal complications: IUGR.   Prenatal Complications - Diabetes: none.    Dilation: Closed Effacement (%): 60 Station: -1 Exam by:: J Mbugua RN Blood pressure 115/76, pulse (!) 58, height 5\' 1"  (1.549 m), weight 78.5 kg, last menstrual period 07/28/2019. Maternal Exam:  Uterine Assessment: Contraction strength is mild.  Contraction frequency is irregular.    Abdomen: Patient reports no abdominal tenderness. Fetal presentation: vertex  Introitus: Normal vulva. Normal vagina.  Ferning test: not done.  Nitrazine test: not done.  Pelvis: adequate for delivery.   Cervix: Cervix evaluated by digital exam.     Physical Exam Vitals and nursing note reviewed.  Constitutional:      Appearance: Normal appearance.  HENT:     Head: Normocephalic and atraumatic.  Cardiovascular:     Rate and Rhythm: Normal rate and regular rhythm.     Pulses: Normal pulses.     Heart sounds: Normal heart sounds.  Pulmonary:     Effort: Pulmonary effort is normal.     Breath sounds: Normal breath sounds.  Abdominal:     Palpations: Abdomen is soft.  Genitourinary:    General: Normal vulva.  Musculoskeletal:        General: Normal range of motion.     Cervical back: Normal range of motion and neck supple.  Skin:    General: Skin is warm and dry.  Neurological:     General: No focal deficit present.     Mental Status: She is alert.  Psychiatric:        Mood and Affect: Mood normal.        Behavior: Behavior normal.     Prenatal labs: ABO, Rh: --/--/O POS (  09/27 0050) Antibody: NEG (09/27 0050) Rubella: Immune (02/23 0000) RPR: Nonreactive (02/23 0000)  HBsAg: Negative (02/23 0000)  HIV: Non-reactive (02/23 0000)  GBS: Positive/-- (09/03 0000)   Assessment/Plan: 40 wk IUP SGA vs IUGR IOL   Erin Vance J 05/02/2020, 6:33 AM

## 2020-05-03 LAB — CBC
HCT: 36.2 % (ref 36.0–46.0)
Hemoglobin: 11.7 g/dL — ABNORMAL LOW (ref 12.0–15.0)
MCH: 28.9 pg (ref 26.0–34.0)
MCHC: 32.3 g/dL (ref 30.0–36.0)
MCV: 89.4 fL (ref 80.0–100.0)
Platelets: 170 10*3/uL (ref 150–400)
RBC: 4.05 MIL/uL (ref 3.87–5.11)
RDW: 13 % (ref 11.5–15.5)
WBC: 8.7 10*3/uL (ref 4.0–10.5)
nRBC: 0 % (ref 0.0–0.2)

## 2020-05-03 NOTE — Progress Notes (Signed)
MOB was referred for history of depression/anxiety. * Referral screened out by Clinical Social Worker because none of the following criteria appear to apply: ~ History of anxiety/depression during this pregnancy, or of post-partum depression following prior delivery. ~ Diagnosis of anxiety and/or depression within last 3 years. Per further chart review, MOB was diagnosed with anxiety/depression in 2016.  OR * MOB's symptoms currently being treated with medication and/or therapy. It is also noted that MOB has active prescription for Xanax PRN for anxiety/depression.    CSW aware that MOB scored 0 on [place] with no further concerns noted. Please reconsult CSW if new need arises or if MOB wishes to speak with CSW.     Erin Vance, MSW, LCSW [facility] [place] [phone]

## 2020-05-03 NOTE — Lactation Note (Signed)
This note was copied from a baby's chart. Lactation Consultation Note  Patient Name: Erin Vance [id number] Date: 05/03/2020 Reason for consult: Follow-up assessment;Difficult latch  Follow up visit 34 hours old infant with 2.66% of weight loss of a P1 mother. Mother explains she has been using DM to supplement. Mother has a DEBP set up but she is not collecting anything yet.  Infant is cueing, used 20 mm NS to latch infant right breast, football position. Infant is uncoordinated and has a difficult latch. Instill 2 mL of DM with a curved tip syringe into NS. Noted infant improving coordination at sucking. Observed a few sucks but no swallowing.  Talked to mother about hand expressing and training infant to suck finger-feeding some EBM. Noted improvement while mother offer ~57mL of HE colostrum. Encouraged using DEBP to stimulate her breasts after each supplementation.  Reviewed with mother average size of a NB stomach. Encourage to follow babies' hunger and fullness cues. Reviewed colostrum benefits for baby, mother plans to continue HE and finger-feed infant.  Promoted maternal rest, hydration and food intake. Encouraged to contact [facility] for support when ready to breastfeed baby and recommended to request help for questions or concerns.    All questions answered at this time.   Feeding Feeding Type: Breast Milk with Donor Milk Nipple Type: Slow - flow  LATCH Score Latch: Repeated attempts needed to sustain latch, nipple held in mouth throughout feeding, stimulation needed to elicit sucking reflex.  Audible Swallowing: None  Type of Nipple: Everted at rest and after stimulation (short nipples)  Comfort (Breast/Nipple): Soft / non-tender  Hold (Positioning): Assistance needed to correctly position infant at breast and maintain latch.  LATCH Score: 6  Interventions Interventions: Breast feeding basics reviewed;Assisted with latch;Skin to skin;Breast massage;Hand express;Adjust  position;DEBP;Hand pump;Expressed milk;Support pillows  Lactation Tools Discussed/Used Tools: Pump;Nipple Dorris Carnes;Bottle Nipple shield size: 20 Breast pump type: Double-Electric Breast Pump;Manual Pump Review: Setup, frequency, and cleaning Initiated by:: LC Date initiated:: 05/03/20   Consult Status Consult Status: Follow-up Date: 05/04/20 Follow-up type: In-patient    Janelys Glassner A Higuera Ancidey 05/03/2020, 8:46 PM

## 2020-05-03 NOTE — Lactation Note (Signed)
This note was copied from a baby's chart. Lactation Consultation Note  Patient Name: Erin Vance [id number] Date: 05/03/2020 Reason for consult: Follow-up assessment;Mother's request Assisted with trying to get infant to breastfeed longer.  Infant breastfed about 5 more minutes fell asleep.  Left mom and baby STS.  Discussed initiating pumping with DEBP.  Mom in agreement. Parents to call when ready to start pumping.  Maternal Data Formula Feeding for Exclusion: No Has patient been taught Hand Expression?: Yes Does the patient have breastfeeding experience prior to this delivery?: No  Feeding Feeding Type: Breast Fed  LATCH Score Latch: Repeated attempts needed to sustain latch, nipple held in mouth throughout feeding, stimulation needed to elicit sucking reflex.  Audible Swallowing: A few with stimulation  Type of Nipple: Everted at rest and after stimulation  Comfort (Breast/Nipple): Filling, red/small blisters or bruises, mild/mod discomfort  Hold (Positioning): Assistance needed to correctly position infant at breast and maintain latch.  LATCH Score: 6  Interventions Interventions: Breast feeding basics reviewed;Assisted with latch;DEBP  Lactation Tools Discussed/Used Tools: Nipple Shields Nipple shield size: 24 Pump Review: Setup, frequency, and cleaning Initiated by:: LC Date initiated:: 05/03/20   Consult Status Consult Status: Follow-up Date: 05/03/20 Follow-up type: In-patient    [facility] Michaelle Copas 05/03/2020, 6:14 PM

## 2020-05-03 NOTE — Lactation Note (Addendum)
This note was copied from a baby's chart. Lactation Consultation Note  Patient Name: Erin Vance [id number] Date: 05/03/2020 Reason for consult: Follow-up assessment;Mother's request Went to inittiate pumping with mom.  RN in to assess infant.  Infant cuing.  Assisted with latch.  Infant breastfed about 5 minutes in laid back breastfeeding.  Fell asleep.  Intiitated pumping with mom.  Left mom pumping.   Plan: 1.Offer the breast on cue and 8-12 times day 2. Post pump and hand express every 3 hours and feed back all expressed mothers milk and donor breastmilk of recommended amount.  Call lactation as needed.  Maternal Data Formula Feeding for Exclusion: No Has patient been taught Hand Expression?: Yes Does the patient have breastfeeding experience prior to this delivery?: No  Feeding Feeding Type: Breast Fed  LATCH Score Latch: Repeated attempts needed to sustain latch, nipple held in mouth throughout feeding, stimulation needed to elicit sucking reflex.  Audible Swallowing: A few with stimulation  Type of Nipple: Everted at rest and after stimulation  Comfort (Breast/Nipple): Filling, red/small blisters or bruises, mild/mod discomfort  Hold (Positioning): Assistance needed to correctly position infant at breast and maintain latch.  LATCH Score: 6  Interventions Interventions: Breast feeding basics reviewed;Assisted with latch;DEBP  Lactation Tools Discussed/Used Tools: Nipple Shields Nipple shield size: 24 Pump Review: Setup, frequency, and cleaning Initiated by:: LC Date initiated:: 05/03/20   Consult Status Consult Status: Follow-up Date: 05/03/20 Follow-up type: In-patient    Zahlia Deshazer Michaelle Copas 05/03/2020, 6:16 PM

## 2020-05-03 NOTE — Progress Notes (Signed)
PPD # 1 S/P NSVD  Live born female  Birth Weight: 6 lb 10 oz (3005 g) APGAR: 9, 9  Newborn Delivery   Birth date/time: 05/02/2020 10:20:00 Delivery type: Vaginal, Spontaneous     Baby name: Erin Vance Delivering provider: Olivia Mackie  Episiotomy:Median   Lacerations:2nd degree   Feeding: breast  Pain control at delivery: None   S:  Reports feeling well. Minimal pain and well controlled with Motrin. Breastfeeding going well, using nipple shield. Lactation has been assisting.              Tolerating po/ No nausea or vomiting             Bleeding is moderate             Pain controlled with acetaminophen and ibuprofen (OTC)             Up ad lib / ambulatory / voiding without difficulties   O:  A & O x 3, in no apparent distress              VS:  Vitals:   05/02/20 1730 05/02/20 2120 05/03/20 0030 05/03/20 0528  BP: 110/63 122/80 138/89 125/78  Pulse: 72 68 63 64  Resp: 16 18 18 18   Temp: 98.7 F (37.1 C) 98.1 F (36.7 C) 97.7 F (36.5 C)   TempSrc: Axillary Oral Oral Oral  SpO2: 97%     Weight:      Height:        LABS:  Recent Labs    05/02/20 0100 05/03/20 0620  WBC 8.0 8.7  HGB 12.8 11.7*  HCT 38.2 36.2  PLT 196 170    Blood type: --/--/O POS (09/27 0050)  Rubella: Immune (02/23 0000)   I&O: I/O last 3 completed shifts: In: -  Out: 300 [Blood:300]          No intake/output data recorded.  Vaccines: TDaP UTD         Flu    N/A   Gen: AAO x 3, NAD  Abdomen: soft, non-tender, non-distended             Fundus: firm, non-tender, U-1  Perineum: repair intact, no edema  Lochia: moderate  Extremities: no edema, no calf pain or tenderness   A/P: PPD # 1 39 y.o., 24   Principal Problem:   Postpartum care following vaginal delivery 9/27 Active Problems:   Encounter for induction of labor   SVD (spontaneous vaginal delivery) 9/27  Doing well - stable status  Routine post partum orders  Breastfeeding support prn   Second degree perineal  laceration with episiotomy  Encouraged use of peri bottle, witch hazel pads, and Dermoplast for perineal pain  Discussed herbal sitz baths after discharge  Anticipate discharge tomorrow   10/27, MSN, CNM 05/03/2020, 10:30 AM

## 2020-05-04 MED ORDER — ACETAMINOPHEN 325 MG PO TABS
650.0000 mg | ORAL_TABLET | ORAL | 1 refills | Status: DC | PRN
Start: 2020-05-04 — End: 2023-03-15

## 2020-05-04 MED ORDER — BENZOCAINE-MENTHOL 20-0.5 % EX AERO: 1.0000 "application " | INHALATION_SPRAY | CUTANEOUS | 1 refills | Status: DC | PRN

## 2020-05-04 MED ORDER — IBUPROFEN 600 MG PO TABS
600.0000 mg | ORAL_TABLET | Freq: Four times a day (QID) | ORAL | 0 refills | Status: DC
Start: 2020-05-04 — End: 2023-03-15

## 2020-05-04 MED ORDER — COCONUT OIL OIL: 1.0000 "application " | TOPICAL_OIL | 0 refills | Status: DC | PRN

## 2020-05-04 NOTE — Lactation Note (Signed)
This note was copied from a baby's chart. Lactation Consultation Note Baby 33 hrs old. F/u w/mom. Mom giving donor milk in bottle w/slow flow nipple. Noted improvement to breast w/less edema.  Shells given to assist in everting nipples. Mom attempting to latch. Rt. Nipple more compressible per mom. Noted flat. Mom is stimulating nipple w/finger tips. Baby latched but went to sleep and no suckling.  Mom has #20 NS but feels that she doesn't need it at this time that baby is latching to her breast. LC thinks mom still needs NS in order to obtain deep latch.   LC encouraged mom to use DEBP. Cluster feeding discussed. Mom feels baby is doing better at this breast. LC isn't seeing anything at the breast. Day  LC will f/u.  Patient Name: Erin Vance [id number] Date: 05/04/2020 Reason for consult: Follow-up assessment;Term;Primapara   Maternal Data Has patient been taught Hand Expression?: Yes Does the patient have breastfeeding experience prior to this delivery?: No  Feeding Feeding Type: Donor Breast Milk Nipple Type: Slow - flow  LATCH Score Latch: Too sleepy or reluctant, no latch achieved, no sucking elicited.  Audible Swallowing: None  Type of Nipple: Flat  Comfort (Breast/Nipple): Filling, red/small blisters or bruises, mild/mod discomfort (slight edema. noted improvement)  Hold (Positioning): No assistance needed to correctly position infant at breast.  LATCH Score: 4  Interventions Interventions: Breast feeding basics reviewed;Breast compression;Shells;Breast massage;Support pillows;Position options;Hand express;Pre-pump if needed  Lactation Tools Discussed/Used Tools: Shells;Pump Shell Type: Inverted   Consult Status Consult Status: Follow-up Date: 05/04/20 Follow-up type: In-patient    Charyl Dancer 05/04/2020, 5:51 AM

## 2020-05-04 NOTE — Lactation Note (Signed)
This note was copied from a baby's chart. Lactation Consultation Note Baby 44 hrs old. Mom is exhausted. Mom is worried about the baby not BF well and not being satisfied from the breast needing supplemented. The way mom describes baby acting by shaking her head at the breast is that she is searching for the nipple. Mom has flat compressible nipples.  Mom has #20 NS. Mom hasn't been wearing shells. They were on the counter and mom didn't know about the shells. LC encouraged mom to wear them in am, alternate them when baby is cluster feeding. LC is going to recommend a Speech evaluation.  Encouraged mom to nap while baby is resting and call for this LC when baby is cueing to feed again. Reported to RN and tech not to disturb mom while sleeping.  Patient Name: Erin Vance [id number] Date: 05/04/2020 Reason for consult: Mother's request;Term   Maternal Data    Feeding    LATCH Score                   Interventions    Lactation Tools Discussed/Used     Consult Status Consult Status: Follow-up Date: 05/04/20 Follow-up type: In-patient    Charyl Dancer 05/04/2020, 3:28 AM

## 2020-05-04 NOTE — Lactation Note (Addendum)
This note was copied from a baby's chart. Lactation Consultation Note  Patient Name: Girl Navneet Schmuck [id number] Date: 05/04/2020 Reason for consult: Follow-up assessment   P1, Baby 48 hours old.  Baby is 6 lbs. 2 oz.  Parents state she is sleepy at the breast. Discussed unwrapping baby for feed and changing her diaper.   Mother is pumping.  Had her sit upright and adjusted flanges. She is using 21 flange on R side and 24 flange on right side.  Noted good flow with pumping with some rusty pipe on R side.  Suggest mother call when baby cues to assist with feeding. Mother did not attempt breastfeeding during the night and gave donor milk. Encouraged breastfeeding with feeding cues and supplementing w/ donor milk and mother's milk after. Mother states she has two DEBPs at home.    Maternal Data    Feeding Feeding Type: Donor Breast Milk  LATCH Score                   Interventions Interventions: Breast feeding basics reviewed;DEBP  Lactation Tools Discussed/Used     Consult Status Consult Status: Follow-up Date: 05/04/20 Follow-up type: In-patient    Dahlia Byes [facility] 05/04/2020, 10:36 AM

## 2020-05-04 NOTE — Discharge Instructions (Signed)
Erin Vance, IBCLC with [facility] for lactation support.  

## 2020-05-04 NOTE — Lactation Note (Signed)
This note was copied from a baby's chart. Lactation Consultation Note  Patient Name: Erin Vance [id number] Date: 05/04/2020 Reason for consult: Follow-up assessment   P1, Baby 49 hours old.  Returned to room to observe latch. Baby latched easily and eager STS on R breast. Provided pillows for support.  Mother doing a great job with positioning and compressing breast during feeding.  Baby actively sucking with intermittent swallows. Mother pumped approx 5 ml which she will give her after feeding. Encouraged mother to continue pumping 3-4 times per day and giving back to baby. Reviewed engorgement care and monitoring voids/stools. Praised first time parents for their hard work and commitment.    Maternal Data    Feeding Feeding Type: Breast Fed Nipple Type: Slow - flow  LATCH Score Latch: Grasps breast easily, tongue down, lips flanged, rhythmical sucking.  Audible Swallowing: A few with stimulation  Type of Nipple: Everted at rest and after stimulation  Comfort (Breast/Nipple): Soft / non-tender  Hold (Positioning): Assistance needed to correctly position infant at breast and maintain latch.  LATCH Score: 8  Interventions Interventions: Breast feeding basics reviewed;DEBP;Breast compression  Lactation Tools Discussed/Used     Consult Status Consult Status: Complete Date: 05/04/20 Follow-up type: In-patient    Dahlia Byes [facility] 05/04/2020, 11:32 AM

## 2020-05-04 NOTE — Discharge Summary (Signed)
OB Discharge Summary  Patient Name: Erin Vance DOB: May 01, 1981 MRN: [id number]  Date of admission: 05/02/2020 Delivering provider: Olivia Mackie   Admitting diagnosis: Encounter for induction of labor [Z34.90] Intrauterine pregnancy: [redacted]w[redacted]d     Secondary diagnosis: Patient Active Problem List   Diagnosis Date Noted  . SVD (spontaneous vaginal delivery) 9/27 05/03/2020  . Second degree perineal laceration with episiotomy 05/03/2020  . Postpartum care following vaginal delivery 9/27 05/03/2020  . Encounter for induction of labor 05/02/2020   Additional problems:None   Date of discharge: 05/04/2020   Discharge diagnosis: Principal Problem:   Postpartum care following vaginal delivery 9/27 Active Problems:   Encounter for induction of labor   SVD (spontaneous vaginal delivery) 9/27   Second degree perineal laceration with episiotomy                                                          Post partum procedures:None  Augmentation: Cytotec Pain control: None  Laceration:2nd degree  Episiotomy:Median  Complications: None  Hospital course:  Induction of Labor With Vaginal Delivery   39 y.o. yo G3P0020 at [redacted]w[redacted]d was admitted to the hospital 05/02/2020 for induction of labor.  Indication for induction: probable SGA.  Patient had an uncomplicated labor course as follows: Membrane Rupture Time/Date: 8:00 AM ,05/02/2020   Delivery Method:Vaginal, Spontaneous  Episiotomy: Median  Lacerations:  2nd degree  Details of delivery can be found in separate delivery note.  Patient had a routine postpartum course. Patient is discharged home 05/04/20.  Newborn Data: Birth date:05/02/2020  Birth time:10:20 AM  Gender:Female  Living status:Living  Apgars:9 ,9  Weight:3005 g   Physical exam  Vitals:   05/03/20 0030 05/03/20 0528 05/03/20 2223 05/04/20 0654  BP: 138/89 125/78 116/74 129/90  Pulse: 63 64 79 74  Resp: 18 18 18 18   Temp: 97.7 F (36.5 C)  98 F (36.7 C) 98.2 F (36.8 C)   TempSrc: Oral Oral Oral Oral  SpO2:    97%  Weight:      Height:       General: alert, cooperative and no distress Lochia: appropriate Uterine Fundus: firm  Perineum: repair intact, no edema DVT Evaluation: No evidence of DVT seen on physical exam. No significant calf/ankle edema. Labs: Lab Results  Component Value Date   WBC 8.7 05/03/2020   HGB 11.7 (L) 05/03/2020   HCT 36.2 05/03/2020   MCV 89.4 05/03/2020   PLT 170 05/03/2020   CMP Latest Ref Rng & Units 08/13/2019  Glucose 65 - 99 mg/dL 85  BUN 6 - 20 mg/dL 11  Creatinine 10/11/2019 - 1.27 mg/dL 5.17  Sodium 0.01 - 749 mmol/L 139  Potassium 3.5 - 5.2 mmol/L 4.1  Chloride 96 - 106 mmol/L 101  CO2 20 - 29 mmol/L 24  Calcium 8.7 - 10.2 mg/dL 9.4  Total Protein 6.0 - 8.5 g/dL 6.5  Total Bilirubin 0.0 - 1.2 mg/dL 0.5  Alkaline Phos 39 - 117 IU/L 49  AST 0 - 40 IU/L 16  ALT 0 - 32 IU/L 15   Edinburgh Postnatal Depression Scale Screening Tool 05/02/2020  I have been able to laugh and see the funny side of things. 0  I have looked forward with enjoyment to things. 0  I have blamed myself unnecessarily when things went wrong. 0  I  have been anxious or worried for no good reason. 0  I have felt scared or panicky for no good reason. 0  Things have been getting on top of me. 0  I have been so unhappy that I have had difficulty sleeping. 0  I have felt sad or miserable. 0  I have been so unhappy that I have been crying. 0  The thought of harming myself has occurred to me. 0  Edinburgh Postnatal Depression Scale Total 0   Vaccines: TDaP UTD         Flu    NA  Discharge instruction:  per After Visit Summary,  [facility] booklet and  "Understanding Mother & Baby Care" hospital booklet  After Visit Meds:  Allergies as of 05/04/2020      Reactions   Cephalexin       Medication List    STOP taking these medications   ALPRAZolam 0.25 MG tablet Commonly known as: XANAX   fluocinonide cream 0.05 % Commonly known as:  LIDEX     TAKE these medications   acetaminophen 325 MG tablet Commonly known as: Tylenol Take 2 tablets (650 mg total) by mouth every 4 (four) hours as needed (for pain scale < 4).   benzocaine-Menthol 20-0.5 % Aero Commonly known as: DERMOPLAST Apply 1 application topically as needed for irritation (perineal discomfort).   coconut oil Oil Apply 1 application topically as needed.   ibuprofen 600 MG tablet Commonly known as: ADVIL Take 1 tablet (600 mg total) by mouth every 6 (six) hours.   PRENATAL PO Take 1 tablet by mouth daily.            Discharge Care Instructions  (From admission, onward)         Start     Ordered   05/04/20 0000  Discharge wound care:       Comments: Sitz baths 2 times /day with warm water x 1 week. May add herbals: 1 ounce dried comfrey leaf* 1 ounce calendula flowers 1 ounce lavender flowers  Supplies can be found online at [employer] sources at [employer], [employer]  1/2 ounce dried uva ursi leaves 1/2 ounce witch hazel blossoms (if you can find them) 1/2 ounce dried sage leaf 1/2 cup sea salt Directions: Bring 2 quarts of water to a boil. Turn off heat, and place 1 ounce (approximately 1 large handful) of the above mixed herbs (not the salt) into the pot. Steep, covered, for 30 minutes.  Strain the liquid well with a fine mesh strainer, and discard the herb material. Add 2 quarts of liquid to the tub, along with the 1/2 cup of salt. This medicinal liquid can also be made into compresses and peri-rinses.   05/04/20 1117         Diet: routine diet  Activity: Advance as tolerated. Pelvic rest for 6 weeks.   Postpartum contraception: Not Discussed  Newborn Data: Live born female  Birth Weight: 6 lb 10 oz (3005 g) APGAR: 9, 9  Newborn Delivery   Birth date/time: 05/02/2020 10:20:00 Delivery type: Vaginal, Spontaneous     Named [facility] [place] Baby Feeding: Breast Disposition:home with mother  Delivery  Report:  Review the Delivery Report for details.    Follow up:  Follow-up Information    Olivia Mackie, MD. Schedule an appointment as soon as possible for a visit in 6 week(s).   Specialty: Obstetrics and Gynecology Why: Please make an appointment for 6 weeks postpartum.  Contact information: [address] [place] [address] [phone]              Signed: Clancy Gourd, MSN 05/04/2020, 11:18 AM

## 2020-10-16 IMAGING — US ULTRASOUND LEFT BREAST LIMITED
1 series · 2 of 2 positions shown · non-contrast
Comparison: None

CLINICAL DATA: 38-year-old patient has recently palpated a tender
area in the far medial left breast, approximately 8 o'clock region.
She has started a new exercise program, but is unsure if the two are
related. She does not palpate a firm lump or thickening in the left
breast. This is her baseline mammogram.

EXAM:
DIGITAL DIAGNOSTIC BILATERAL MAMMOGRAM WITH CAD AND TOMO
ULTRASOUND LEFT BREAST

[Series 1: ultrasound left breast limited · 0.06mm/px · 2 of 2 slices shown]
[im 1/2]
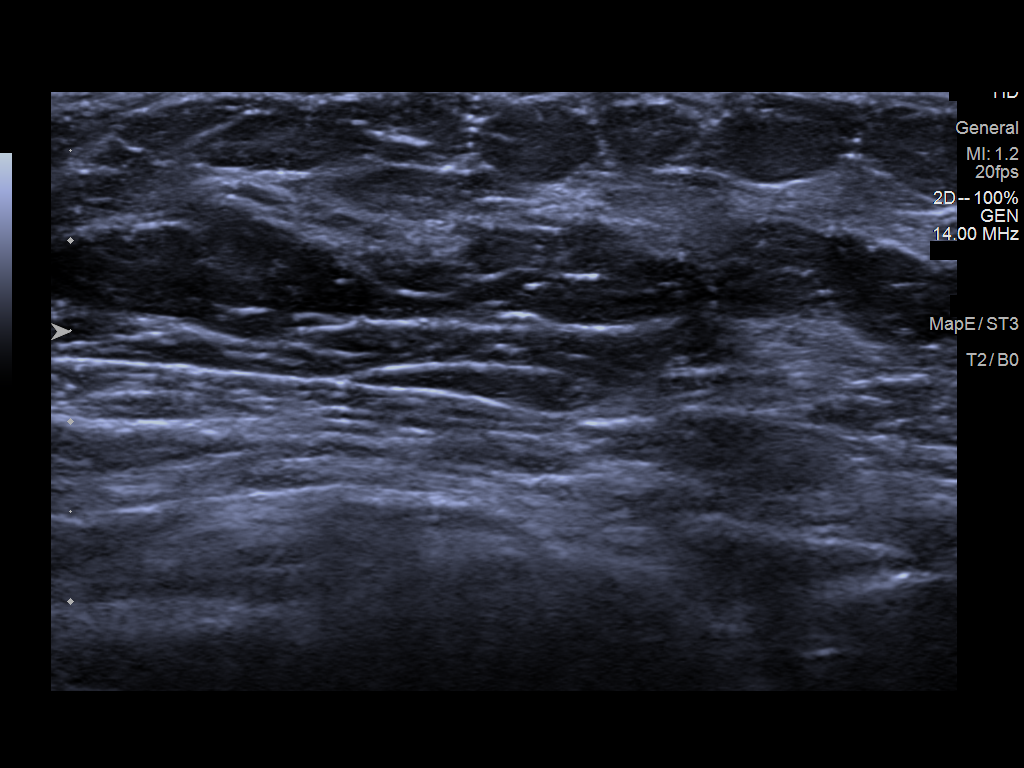
[im 2/2]
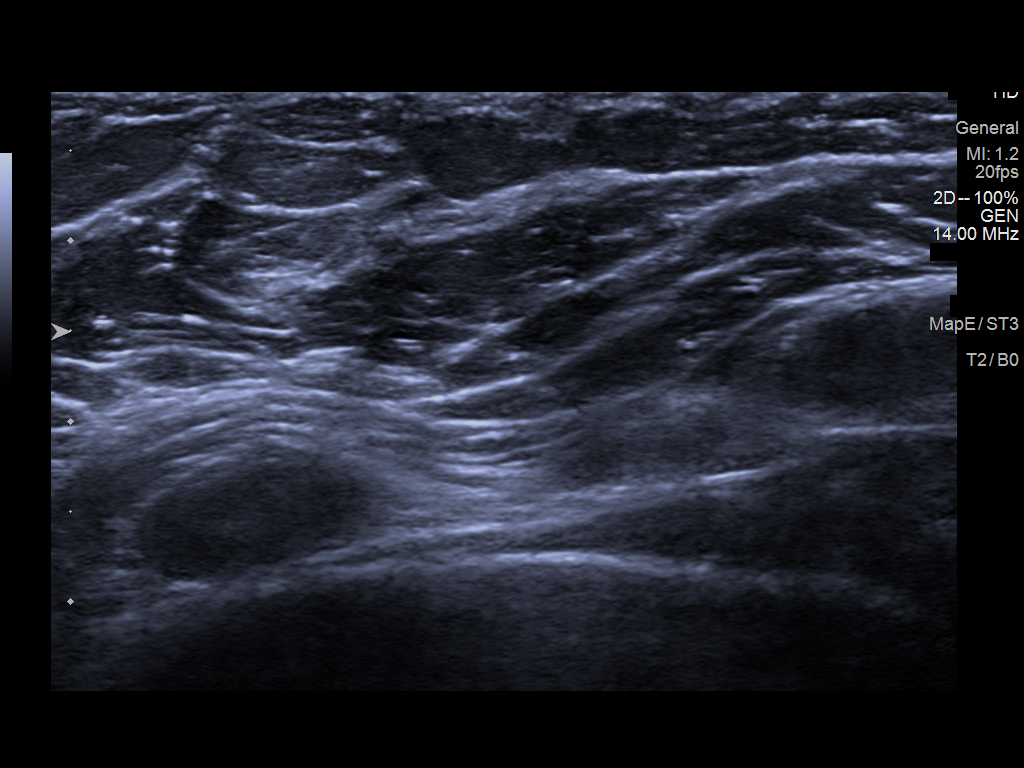

[2 of 2 positions shown; findings below may reference images not displayed]

ACR Breast Density Category c: The breast tissue is heterogeneously
dense, which may obscure small masses.
FINDINGS: Spot compression view of the region of patient concern in the far
medial left breast shows some glandular tissue, however no mass or
distortion is present.

No mass, distortion, or suspicious microcalcification is identified
in either breast to suggest malignancy.

Mammographic images were processed with CAD.

On physical exam, the skin of the left breast appears normal. I do
not palpate a mass or thickening in the 7-8 o'clock left breast in
the region of patient concern, which is approximately 7 cm from the
nipple.

Targeted ultrasound is performed, showing normal breast parenchyma.
No solid or cystic mass or abnormal shadowing is identified in the
region of patient concern.
IMPRESSION: No evidence of malignancy in either breast.

RECOMMENDATION:
Screening mammogram at age 40 unless there are persistent or
intervening clinical concerns. (Code:[id number]-8-RFF)

I have discussed the findings and recommendations with the patient.
Results were also provided in writing at the conclusion of the
visit. If applicable, a reminder letter will be sent to the patient
regarding the next appointment.

BI-RADS CATEGORY  1: Negative.

## 2023-01-30 ENCOUNTER — Telehealth: Payer: Self-pay

## 2023-01-30 NOTE — Telephone Encounter (Signed)
Document printed, patient notified and placed at front desk for pick up   Copied from CRM [id number]. Topic: General - Other >> Jan 29, 2023  4:22 PM Everette C wrote: Reason for CRM: The patient has called to request a copy of their immunization records be printed and to be notified when they would be able to pick them up   Please contact further when possible

## 2023-03-15 ENCOUNTER — Ambulatory Visit: Payer: BC Managed Care – PPO | Admitting: Family Medicine

## 2023-03-15 ENCOUNTER — Encounter: Payer: Self-pay | Admitting: Family Medicine

## 2023-03-15 VITALS — BP 117/74 | HR 71 | Temp 97.8°F | Ht 61.0 in | Wt 149.0 lb

## 2023-03-15 DIAGNOSIS — Z833 Family history of diabetes mellitus: Secondary | ICD-10-CM

## 2023-03-15 DIAGNOSIS — Z021 Encounter for pre-employment examination: Secondary | ICD-10-CM | POA: Insufficient documentation

## 2023-03-15 DIAGNOSIS — Z0000 Encounter for general adult medical examination without abnormal findings: Secondary | ICD-10-CM

## 2023-03-15 DIAGNOSIS — Z1159 Encounter for screening for other viral diseases: Secondary | ICD-10-CM

## 2023-03-15 DIAGNOSIS — Z1231 Encounter for screening mammogram for malignant neoplasm of breast: Secondary | ICD-10-CM | POA: Insufficient documentation

## 2023-03-15 DIAGNOSIS — Z8249 Family history of ischemic heart disease and other diseases of the circulatory system: Secondary | ICD-10-CM

## 2023-03-15 DIAGNOSIS — Z8051 Family history of malignant neoplasm of kidney: Secondary | ICD-10-CM

## 2023-03-15 MED ORDER — NORGESTIMATE-ETH ESTRADIOL 0.25-35 MG-MCG PO TABS: 1.0000 | ORAL_TABLET | Freq: Every day | ORAL | 11 refills | Status: DC

## 2023-03-15 NOTE — Progress Notes (Signed)
Complete physical exam  Patient: Erin Vance   DOB: March 05, 1981   42 y.o. Female  MRN: [id number] Visit Date: 03/15/2023  Today's healthcare provider: Jacky Kindle, FNP  Patient presents for new patient visit to establish care.  Introduced to Publishing rights manager role and practice setting.  All questions answered.  Discussed provider/patient relationship and expectations.  Chief Complaint  Patient presents with   Establish Care   Subjective    Erin Vance is a 42 y.o. female who presents today for a complete physical exam.  She reports consuming a general diet. The patient does not participate in regular exercise at present. She generally feels fairly well. She reports sleeping fairly well. She does have additional problems to discuss today.   HPI HPI   Patient needs [facility] physical form filled out.  Patient wishes to discuss contraception Last edited by Adline Peals, CMA on 03/15/2023 10:12 AM.      Past Medical History:  Diagnosis Date   Anxiety    Past Surgical History:  Procedure Laterality Date   NO PAST SURGERIES     Social History   Socioeconomic History   Marital status: Significant Other    Spouse name: Not on file   Number of children: Not on file   Years of education: Not on file   Highest education level: Master's degree (e.g., MA, MS, MEng, MEd, MSW, MBA)  Occupational History   Not on file  Tobacco Use   Smoking status: Never   Smokeless tobacco: Never  Vaping Use   Vaping status: Never Used  Substance and Sexual Activity   Alcohol use: Yes    Alcohol/week: 2.0 standard drinks of alcohol    Types: 2 Cans of beer per week    Comment: occasionally   Drug use: No   Sexual activity: Yes    Birth control/protection: None  Other Topics Concern   Not on file  Social History Narrative   Not on file   Social Determinants of Health   Financial Resource Strain: High Risk (03/14/2023)   Overall Financial Resource Strain (CARDIA)     Difficulty of Paying Living Expenses: Hard  Food Insecurity: No Food Insecurity (03/14/2023)   Hunger Vital Sign    Worried About Running Out of Food in the Last Year: Never true    Ran Out of Food in the Last Year: Never true  Transportation Needs: No Transportation Needs (03/14/2023)   PRAPARE - Administrator, Civil Service (Medical): No    Lack of Transportation (Non-Medical): No  Physical Activity: Insufficiently Active (03/14/2023)   Exercise Vital Sign    Days of Exercise per Week: 3 days    Minutes of Exercise per Session: 20 min  Stress: No Stress Concern Present (03/14/2023)   [employer] of Occupational Health - Occupational Stress Questionnaire    Feeling of Stress : Only a little  Social Connections: Moderately Integrated (03/14/2023)   Social Connection and Isolation Panel [NHANES]    Frequency of Communication with Friends and Family: More than three times a week    Frequency of Social Gatherings with Friends and Family: Once a week    Attends Religious Services: More than 4 times per year    Active Member of [employer] or Organizations: Yes    Attends Banker Meetings: More than 4 times per year    Marital Status: Widowed  Intimate Partner Violence: Not on file   Family Status  Relation Name Status  Mother  Deceased   Father  Alive   Sister 1 Alive   Sister 2 Alive   Mat Uncle  Alive   MGM  Alive   PGM  (Not Specified)   PGF  Deceased  No partnership data on file   Family History  Problem Relation Age of Onset   Hypertension Mother    Kidney cancer Mother 43       Kidney removal   Valvular heart disease Mother    Heart attack Father    Diabetes Sister    Bell's palsy Sister    Congestive Heart Failure Sister    ALS Maternal Uncle    Dementia Maternal Grandmother    Breast cancer Paternal Grandmother    Heart attack Paternal Grandfather    Allergies  Allergen Reactions   Cephalexin     Patient Care Team: Jacky Kindle, FNP as  PCP - General (Family Medicine)   Medications: Outpatient Medications Prior to Visit  Medication Sig   [DISCONTINUED] acetaminophen (TYLENOL) 325 MG tablet Take 2 tablets (650 mg total) by mouth every 4 (four) hours as needed (for pain scale < 4).   [DISCONTINUED] benzocaine-Menthol (DERMOPLAST) 20-0.5 % AERO Apply 1 application topically as needed for irritation (perineal discomfort).   [DISCONTINUED] coconut oil OIL Apply 1 application topically as needed.   [DISCONTINUED] ibuprofen (ADVIL) 600 MG tablet Take 1 tablet (600 mg total) by mouth every 6 (six) hours.   [DISCONTINUED] Prenatal Vit-Fe Fumarate-FA (PRENATAL PO) Take 1 tablet by mouth daily.   No facility-administered medications prior to visit.    Objective    BP 117/74 (BP Location: Left Arm, Patient Position: Sitting, Cuff Size: Normal)   Pulse 71   Temp 97.8 F (36.6 C) (Oral)   Ht 5\' 1"  (1.549 m)   Wt 149 lb (67.6 kg)   SpO2 100%   BMI 28.15 kg/m   Physical Exam Vitals and nursing note reviewed.  Constitutional:      General: She is awake. She is not in acute distress.    Appearance: Normal appearance. She is well-developed, well-groomed and normal weight. She is not ill-appearing, toxic-appearing or diaphoretic.  HENT:     Head: Normocephalic and atraumatic.     Jaw: There is normal jaw occlusion. No trismus, tenderness, swelling or pain on movement.     Right Ear: Hearing, tympanic membrane, ear canal and external ear normal. There is no impacted cerumen.     Left Ear: Hearing, tympanic membrane, ear canal and external ear normal. There is no impacted cerumen.     Nose: Nose normal. No congestion or rhinorrhea.     Right Turbinates: Not enlarged, swollen or pale.     Left Turbinates: Not enlarged, swollen or pale.     Right Sinus: No maxillary sinus tenderness or frontal sinus tenderness.     Left Sinus: No maxillary sinus tenderness or frontal sinus tenderness.     Mouth/Throat:     Lips: Pink.     Mouth:  Mucous membranes are moist. No injury.     Tongue: No lesions.     Pharynx: Oropharynx is clear. Uvula midline. No pharyngeal swelling, oropharyngeal exudate, posterior oropharyngeal erythema or uvula swelling.     Tonsils: No tonsillar exudate or tonsillar abscesses.  Eyes:     General: Lids are normal. Lids are everted, no foreign bodies appreciated. Vision grossly intact. Gaze aligned appropriately. No allergic shiner or visual field deficit.       Right eye: No discharge.  Left eye: No discharge.     Extraocular Movements: Extraocular movements intact.     Conjunctiva/sclera: Conjunctivae normal.     Right eye: Right conjunctiva is not injected. No exudate.    Left eye: Left conjunctiva is not injected. No exudate.    Pupils: Pupils are equal, round, and reactive to light.  Neck:     Thyroid: No thyroid mass, thyromegaly or thyroid tenderness.     Vascular: No carotid bruit.     Trachea: Trachea normal.  Cardiovascular:     Rate and Rhythm: Normal rate and regular rhythm.     Pulses: Normal pulses.          Carotid pulses are 2+ on the right side and 2+ on the left side.      Radial pulses are 2+ on the right side and 2+ on the left side.       Dorsalis pedis pulses are 2+ on the right side and 2+ on the left side.       Posterior tibial pulses are 2+ on the right side and 2+ on the left side.     Heart sounds: Normal heart sounds, S1 normal and S2 normal. No murmur heard.    No friction rub. No gallop.  Pulmonary:     Effort: Pulmonary effort is normal. No respiratory distress.     Breath sounds: Normal breath sounds and air entry. No stridor. No wheezing, rhonchi or rales.  Chest:     Chest wall: No tenderness.  Abdominal:     General: Abdomen is flat. Bowel sounds are normal. There is no distension.     Palpations: Abdomen is soft. There is no mass.     Tenderness: There is no abdominal tenderness. There is no right CVA tenderness, left CVA tenderness, guarding or  rebound.     Hernia: No hernia is present.  Genitourinary:    Comments: Exam deferred; denies complaints Musculoskeletal:        General: No swelling, tenderness, deformity or signs of injury. Normal range of motion.     Cervical back: Full passive range of motion without pain, normal range of motion and neck supple. No edema, rigidity or tenderness. No muscular tenderness.     Right lower leg: No edema.     Left lower leg: No edema.  Lymphadenopathy:     Cervical: No cervical adenopathy.     Right cervical: No superficial, deep or posterior cervical adenopathy.    Left cervical: No superficial, deep or posterior cervical adenopathy.  Skin:    General: Skin is warm and dry.     Capillary Refill: Capillary refill takes less than 2 seconds.     Coloration: Skin is not jaundiced or pale.     Findings: No bruising, erythema, lesion or rash.  Neurological:     General: No focal deficit present.     Mental Status: She is alert and oriented to person, place, and time. Mental status is at baseline.     GCS: GCS eye subscore is 4. GCS verbal subscore is 5. GCS motor subscore is 6.     Sensory: Sensation is intact. No sensory deficit.     Motor: Motor function is intact. No weakness.     Coordination: Coordination is intact. Coordination normal.     Gait: Gait is intact. Gait normal.  Psychiatric:        Attention and Perception: Attention and perception normal.        Mood and Affect: Mood  and affect normal.        Speech: Speech normal.        Behavior: Behavior normal. Behavior is cooperative.        Thought Content: Thought content normal.        Cognition and Memory: Cognition and memory normal.        Judgment: Judgment normal.      Last depression screening scores    03/15/2023   10:11 AM 08/13/2019    8:11 AM 06/30/2018    2:19 PM  PHQ 2/9 Scores  PHQ - 2 Score 0 0 0  PHQ- 9 Score 0 0 1   Last fall risk screening    03/15/2023   10:11 AM  Fall Risk   Falls in the past  year? 0  Number falls in past yr: 0   Last Audit-C alcohol use screening    03/14/2023   11:11 AM  Alcohol Use Disorder Test (AUDIT)  1. How often do you have a drink containing alcohol? 2  2. How many drinks containing alcohol do you have on a typical day when you are drinking? 0  3. How often do you have six or more drinks on one occasion? 1  AUDIT-C Score 3  4. How often during the last year have you found that you were not able to stop drinking once you had started? 0  5. How often during the last year have you failed to do what was normally expected from you because of drinking? 0  6. How often during the last year have you needed a first drink in the morning to get yourself going after a heavy drinking session? 0  7. How often during the last year have you had a feeling of guilt of remorse after drinking? 0  8. How often during the last year have you been unable to remember what happened the night before because you had been drinking? 0  9. Have you or someone else been injured as a result of your drinking? 0  10. Has a relative or friend or a doctor or another health worker been concerned about your drinking or suggested you cut down? 0  Alcohol Use Disorder Identification Test Final Score (AUDIT) 3   A score of 3 or more in women, and 4 or more in men indicates increased risk for alcohol abuse, EXCEPT if all of the points are from question 1   No results found for any visits on 03/15/23.  Assessment & Plan    Routine Health Maintenance and Physical Exam  Exercise Activities and Dietary recommendations  Goals   None     Immunization History  Administered Date(s) Administered   DTaP 09/28/1980, 11/26/1980, 02/17/1981, 06/22/1982   HPV Quadrivalent 07/12/2006, 09/19/2006, 01/22/2007   Hepatitis B 10/27/1997, 11/29/1997, 08/17/1998   IPV 09/28/1980, 11/26/1980, 06/22/1982, 03/04/1986   Influenza Split 06/28/2012   Influenza,inj,Quad PF,6+ Mos 04/20/2016, 06/30/2018,  08/13/2019   MMR 06/22/1982, 12/07/1998   Td 12/07/1998   Tdap 07/12/2006, 01/20/2013    Health Maintenance  Topic Date Due   Hepatitis C Screening  Never done   COVID-19 Vaccine (1 - 2023-24 season) Never done   DTaP/Tdap/Td (8 - Td or Tdap) 01/21/2023   INFLUENZA VACCINE  11/04/2023 (Originally 03/07/2023)   PAP SMEAR-Modifier  07/01/2023   HPV VACCINES  Completed   HIV Screening  Completed    Discussed health benefits of physical activity, and encouraged her to engage in regular exercise appropriate for her age and  condition.  Problem List Items Addressed This Visit       Other   Annual physical exam - Primary    UTD on dental and vision Due for PAP this Fall; no complaints Reports recent mammo in [place] this Spring; will send for records Things to do to keep yourself healthy  - Exercise at least 30-45 minutes a day, 3-4 days a week.  - Eat a low-fat diet with lots of fruits and vegetables, up to 7-9 servings per day.  - Seatbelts can save your life. Wear them always.  - Smoke detectors on every level of your home, check batteries every year.  - Eye Doctor - have an eye exam every 1-2 years  - Safe sex - if you may be exposed to STDs, use a condom.  - Alcohol -  If you drink, do it moderately, less than 2 drinks per day.  - Health Care Power of Attorney. Choose someone to speak for you if you are not able.  - Depression is common in our stressful world.If you're feeling down or losing interest in things you normally enjoy, please come in for a visit.  - Violence - If anyone is threatening or hurting you, please call immediately.       Relevant Orders   CBC with Differential/Platelet   Comprehensive Metabolic Panel (CMET)   TSH + free T4   Lipid panel   Hemoglobin A1c   QuantiFERON-TB Gold Plus   Encounter for hepatitis C screening test for low risk patient    Low risk screen Treatable, and curable. If left untreated Hep C can lead to cirrhosis and liver  failure. Encourage routine testing; recommend repeat testing if risk factors change.       Relevant Orders   Hepatitis C Antibody   Family history of cancer of the kidney    Recommend CBC and CMP      Relevant Orders   CBC with Differential/Platelet   Comprehensive Metabolic Panel (CMET)   Family history of diabetes mellitus    Recommend A1c given geriatric pregnancy and family hx      Relevant Orders   Hemoglobin A1c   Family history of hypertension    Recommend TSH and CBC/CMP to assist Normal exam      Relevant Orders   CBC with Differential/Platelet   Comprehensive Metabolic Panel (CMET)   TSH + free T4   Pre-employment health screening examination    [facility] form filled out; [place] verified and TB test pending. No limitations       Relevant Orders   QuantiFERON-TB Gold Plus   RESOLVED: Screening mammogram for breast cancer   Return in about 1 year (around 03/14/2024) for annual examination.    Leilani Merl, FNP, have reviewed all documentation for this visit. The documentation on 03/15/23 for the exam, diagnosis, procedures, and orders are all accurate and complete.  Jacky Kindle, FNP  [facility] [phone] (phone) [phone] (fax)  [facility]

## 2023-03-15 NOTE — Assessment & Plan Note (Addendum)
Recommend A1c given geriatric pregnancy and family hx

## 2023-03-15 NOTE — Assessment & Plan Note (Signed)
Recommend TSH and CBC/CMP to assist Normal exam

## 2023-03-15 NOTE — Assessment & Plan Note (Signed)
[facility] form filled out; [employer] verified and TB test pending. No limitations

## 2023-03-15 NOTE — Assessment & Plan Note (Signed)
Recommend CBC and CMP.

## 2023-03-15 NOTE — Assessment & Plan Note (Signed)
Low risk screen Treatable, and curable. If left untreated Hep C can lead to cirrhosis and liver failure. Encourage routine testing; recommend repeat testing if risk factors change.  

## 2023-03-15 NOTE — Assessment & Plan Note (Signed)
UTD on dental and vision Due for PAP this Fall; no complaints Reports recent mammo in [place] this Spring; will send for records Things to do to keep yourself healthy  - Exercise at least 30-45 minutes a day, 3-4 days a week.  - Eat a low-fat diet with lots of fruits and vegetables, up to 7-9 servings per day.  - Seatbelts can save your life. Wear them always.  - Smoke detectors on every level of your home, check batteries every year.  - Eye Doctor - have an eye exam every 1-2 years  - Safe sex - if you may be exposed to STDs, use a condom.  - Alcohol -  If you drink, do it moderately, less than 2 drinks per day.  - Health Care Power of Attorney. Choose someone to speak for you if you are not able.  - Depression is common in our stressful world.If you're feeling down or losing interest in things you normally enjoy, please come in for a visit.  - Violence - If anyone is threatening or hurting you, please call immediately.

## 2023-03-19 NOTE — Progress Notes (Signed)
TB negative.

## 2023-03-20 ENCOUNTER — Ambulatory Visit: Payer: BC Managed Care – PPO | Admitting: Family Medicine

## 2023-04-15 ENCOUNTER — Ambulatory Visit: Payer: BC Managed Care – PPO | Admitting: Physician Assistant

## 2023-06-28 ENCOUNTER — Encounter: Payer: Self-pay | Admitting: *Deleted

## 2023-06-28 ENCOUNTER — Other Ambulatory Visit (HOSPITAL_COMMUNITY)
Admission: RE | Admit: 2023-06-28 | Discharge: 2023-06-28 | Disposition: A | Discharge status: Home / Self Care | Payer: BC Managed Care – PPO | Source: Ambulatory Visit | Attending: Family Medicine | Admitting: Family Medicine

## 2023-06-28 ENCOUNTER — Encounter: Payer: Self-pay | Admitting: Family Medicine

## 2023-06-28 ENCOUNTER — Ambulatory Visit (INDEPENDENT_AMBULATORY_CARE_PROVIDER_SITE_OTHER): Payer: BC Managed Care – PPO | Admitting: Family Medicine

## 2023-06-28 VITALS — BP 119/79 | HR 73 | Ht 61.0 in | Wt 151.0 lb

## 2023-06-28 DIAGNOSIS — Z01419 Encounter for gynecological examination (general) (routine) without abnormal findings: Secondary | ICD-10-CM | POA: Diagnosis not present

## 2023-06-28 DIAGNOSIS — Z124 Encounter for screening for malignant neoplasm of cervix: Secondary | ICD-10-CM | POA: Insufficient documentation

## 2023-06-28 DIAGNOSIS — Z1239 Encounter for other screening for malignant neoplasm of breast: Secondary | ICD-10-CM | POA: Diagnosis not present

## 2023-06-28 DIAGNOSIS — Z3009 Encounter for other general counseling and advice on contraception: Secondary | ICD-10-CM | POA: Diagnosis not present

## 2023-06-28 MED ORDER — NORELGESTROMIN-ETH ESTRADIOL 150-35 MCG/24HR TD PTWK: 1.0000 | MEDICATED_PATCH | TRANSDERMAL | 12 refills | Status: DC

## 2023-06-28 NOTE — Assessment & Plan Note (Signed)
Wishes to start OCP patch to assist with prevention of unplanned pregnancy; Rx provided Encouraged to f/u as needed; once annually at minimum Previously reviewed possible side effects and complications

## 2023-06-28 NOTE — Assessment & Plan Note (Signed)
PAP smear completed

## 2023-06-28 NOTE — Assessment & Plan Note (Signed)
Normal breast exam; mammo results requested

## 2023-06-28 NOTE — Progress Notes (Unsigned)
Established patient visit   Patient: Erin Vance   DOB: 09-02-1980   42 y.o. Female  MRN: [id number] Visit Date: 06/28/2023  Today's healthcare provider: Jacky Kindle, FNP  Re Introduced to nurse practitioner role and practice setting.  All questions answered.  Discussed provider/patient relationship and expectations.  Subjective    Pt presents for breast exam and PAP. CPE done in 03/2023.  Medications: Outpatient Medications Prior to Visit  Medication Sig   [DISCONTINUED] norgestimate-ethinyl estradiol (ORTHO-CYCLEN) 0.25-35 MG-MCG tablet Take 1 tablet by mouth daily.   No facility-administered medications prior to visit.    Review of Systems Last CBC Lab Results  Component Value Date   WBC 5.7 03/15/2023   HGB 13.1 03/15/2023   HCT 39.7 03/15/2023   MCV 84 03/15/2023   MCH 27.6 03/15/2023   RDW 13.4 03/15/2023   PLT 247 03/15/2023   Last metabolic panel Lab Results  Component Value Date   GLUCOSE 66 (L) 03/15/2023   NA 139 03/15/2023   K 4.2 03/15/2023   CL 102 03/15/2023   CO2 21 03/15/2023   BUN 11 03/15/2023   CREATININE 0.93 03/15/2023   EGFR 79 03/15/2023   CALCIUM 9.5 03/15/2023   PROT 6.8 03/15/2023   ALBUMIN 4.3 03/15/2023   LABGLOB 2.5 03/15/2023   AGRATIO 2.0 08/13/2019   BILITOT 0.4 03/15/2023   ALKPHOS 54 03/15/2023   AST 16 03/15/2023   ALT 11 03/15/2023   Last lipids Lab Results  Component Value Date   CHOL 182 03/15/2023   HDL 69 03/15/2023   LDLCALC 92 03/15/2023   TRIG 120 03/15/2023   CHOLHDL 2.6 03/15/2023   Last hemoglobin A1c Lab Results  Component Value Date   HGBA1C 5.4 03/15/2023   Last thyroid functions Lab Results  Component Value Date   TSH 2.090 03/15/2023     Objective    BP 119/79   Pulse 73   Ht 5\' 1"  (1.549 m)   Wt 151 lb (68.5 kg)   LMP 05/27/2023   SpO2 97%   BMI 28.53 kg/m   BP Readings from Last 3 Encounters:  06/28/23 119/79  03/15/23 117/74  05/04/20 129/90   Wt Readings  from Last 3 Encounters:  06/28/23 151 lb (68.5 kg)  03/15/23 149 lb (67.6 kg)  05/02/20 173 lb (78.5 kg)   SpO2 Readings from Last 3 Encounters:  06/28/23 97%  03/15/23 100%  05/04/20 97%   Physical Exam Vitals and nursing note reviewed.  Constitutional:      General: She is not in acute distress.    Appearance: Normal appearance. She is overweight. She is not ill-appearing, toxic-appearing or diaphoretic.  HENT:     Head: Normocephalic and atraumatic.  Cardiovascular:     Rate and Rhythm: Normal rate and regular rhythm.     Pulses: Normal pulses.     Heart sounds: Normal heart sounds. No murmur heard.    No friction rub. No gallop.  Pulmonary:     Effort: Pulmonary effort is normal. No respiratory distress.     Breath sounds: Normal breath sounds. No stridor. No wheezing, rhonchi or rales.  Chest:     Chest wall: No tenderness.     Comments: Breasts: breasts appear normal, no suspicious masses, no skin or nipple changes or axillary nodes, right breast normal without mass, skin or nipple changes or axillary nodes, left breast normal without mass, skin or nipple changes or axillary nodes, risk and benefit of breast self-exam  was discussed  Abdominal:     Hernia: There is no hernia in the left inguinal area or right inguinal area.  Genitourinary:    General: Normal vulva.     Exam position: Lithotomy position.     Tanner stage (genital): 5.     Vagina: Normal.     Cervix: Normal.     Uterus: Normal.      Adnexa: Right adnexa normal and left adnexa normal.     Comments: PAP smear completed; rectal exam deferred.  Musculoskeletal:        General: No swelling, tenderness, deformity or signs of injury. Normal range of motion.     Right lower leg: No edema.     Left lower leg: No edema.  Skin:    General: Skin is warm and dry.     Capillary Refill: Capillary refill takes less than 2 seconds.     Coloration: Skin is not jaundiced or pale.     Findings: No bruising, erythema,  lesion or rash.  Neurological:     General: No focal deficit present.     Mental Status: She is alert and oriented to person, place, and time. Mental status is at baseline.     Cranial Nerves: No cranial nerve deficit.     Sensory: No sensory deficit.     Motor: No weakness.     Coordination: Coordination normal.  Psychiatric:        Mood and Affect: Mood normal.        Behavior: Behavior normal.        Thought Content: Thought content normal.        Judgment: Judgment normal.     No results found for any visits on 06/28/23.  Assessment & Plan     Problem List Items Addressed This Visit       Other   Birth control counseling    Wishes to start OCP patch to assist with prevention of unplanned pregnancy; Rx provided Encouraged to f/u as needed; once annually at minimum Previously reviewed possible side effects and complications       Screening breast examination    Normal breast exam; mammo results requested      Screening for cervical cancer - Primary    PAP smear completed      Relevant Orders   Cytology - PAP   Return if symptoms worsen or fail to improve.     Leilani Merl, FNP, have reviewed all documentation for this visit. The documentation on 06/30/23 for the exam, diagnosis, procedures, and orders are all accurate and complete.  Jacky Kindle, FNP  [facility] [phone] (phone) [phone] (fax)  [facility]

## 2023-06-30 ENCOUNTER — Encounter: Payer: Self-pay | Admitting: Family Medicine

## 2023-07-02 ENCOUNTER — Ambulatory Visit: Payer: BC Managed Care – PPO | Admitting: Family Medicine

## 2023-07-10 LAB — CYTOLOGY - PAP
Chlamydia: NEGATIVE
Comment: NEGATIVE
Comment: NEGATIVE
Comment: NEGATIVE
Comment: NEGATIVE
Comment: NORMAL
Diagnosis: NEGATIVE
Diagnosis: REACTIVE
HSV1: NEGATIVE
HSV2: NEGATIVE
High risk HPV: NEGATIVE
Neisseria Gonorrhea: NEGATIVE
Trichomonas: NEGATIVE

## 2023-08-08 ENCOUNTER — Encounter: Payer: Self-pay | Admitting: Family Medicine

## 2023-08-09 ENCOUNTER — Other Ambulatory Visit: Payer: Self-pay | Admitting: Family Medicine

## 2023-08-09 MED ORDER — ALPRAZOLAM 0.5 MG PO TABS: 0.5000 mg | ORAL_TABLET | Freq: Three times a day (TID) | ORAL | 0 refills | Status: DC | PRN

## 2023-08-23 ENCOUNTER — Encounter: Payer: Self-pay | Admitting: Physician Assistant

## 2023-08-23 ENCOUNTER — Ambulatory Visit: Payer: 59 | Admitting: Physician Assistant

## 2023-08-23 VITALS — BP 122/68 | HR 82 | Temp 98.1°F | Resp 16 | Ht 61.0 in | Wt 156.0 lb

## 2023-08-23 DIAGNOSIS — R52 Pain, unspecified: Secondary | ICD-10-CM | POA: Diagnosis not present

## 2023-08-23 DIAGNOSIS — J069 Acute upper respiratory infection, unspecified: Secondary | ICD-10-CM

## 2023-08-23 DIAGNOSIS — R509 Fever, unspecified: Secondary | ICD-10-CM

## 2023-08-23 DIAGNOSIS — R6889 Other general symptoms and signs: Secondary | ICD-10-CM

## 2023-08-23 LAB — POCT INFLUENZA A/B
Influenza A, POC: NEGATIVE
Influenza B, POC: NEGATIVE

## 2023-08-23 NOTE — Progress Notes (Signed)
Acute Office Visit   Patient: Erin Vance   DOB: 1981-03-11   43 y.o. Female  MRN: [id number] Visit Date: 08/23/2023  Today's healthcare provider: Oswaldo Conroy Rommie Dunn, PA-C  Introduced myself to the patient as a Secondary school teacher and provided education on APPs in clinical practice.    Chief Complaint  Patient presents with   Nasal Congestion    This morning   Headache    Constant dull ache   Subjective    HPI HPI     Nasal Congestion    Additional comments: This morning        Headache    Additional comments: Constant dull ache      Last edited by Dollene Primrose, CMA on 08/23/2023  2:22 PM.       Discussed the use of AI scribe software for clinical note transcription with the patient, who gave verbal consent to proceed.  History of Present Illness   The patient presented with a sudden onset of symptoms including chills, fever peaking at 102.9 degrees Fahrenheit, body aches, and a localized headache. The fever has since subsided, but the patient reports persistent congestion and a sensation of tightness in the sinus area. She also describes a tickling sensation in the throat that occasionally leads to a mild cough, but denies any significant or lasting cough. The patient denies any nausea, vomiting, diarrhea, or dizziness, but notes that she felt slightly unsteady when getting up during the fever episode. No rashes were reported.  The patient's 76-year-old daughter had a fever a few days prior, which resolved within a day and was followed by a runny nose. The patient's partner has not shown any symptoms. The patient has been managing symptoms with over-the-counter Motrin, which seemed to help with the fever.  The patient denies any history of sinus infections. She has not noticed any post-nasal drainage or experienced any trouble breathing. The patient has not been taking any other medications for her symptoms.           Medications: Outpatient Medications Prior to  Visit  Medication Sig   ALPRAZolam (XANAX) 0.5 MG tablet Take 1 tablet (0.5 mg total) by mouth 3 (three) times daily as needed for anxiety.   norelgestromin-ethinyl estradiol Bur[employer]ico) 150-35 MCG/24HR transdermal patch Place 1 patch onto the skin once a week.   No facility-administered medications prior to visit.    Review of Systems  Constitutional:  Positive for chills and fever.  HENT:  Positive for congestion, ear pain, sinus pressure and sneezing. Negative for postnasal drip.   Respiratory:  Negative for shortness of breath and wheezing.   Gastrointestinal:  Negative for diarrhea, nausea and vomiting.  Musculoskeletal:  Positive for myalgias.  Skin:  Negative for rash.  Neurological:  Positive for headaches. Negative for dizziness and light-headedness.        Objective    BP 122/68   Pulse 82   Temp 98.1 F (36.7 C) (Oral)   Resp 16   Ht 5\' 1"  (1.549 m)   Wt 156 lb (70.8 kg)   SpO2 97%   BMI 29.48 kg/m     Physical Exam Vitals reviewed.  Constitutional:      General: She is awake.     Appearance: Normal appearance. She is well-developed and well-groomed.  HENT:     Head: Normocephalic and atraumatic.     Right Ear: Hearing and ear canal normal. A middle ear effusion is present.  Left Ear: Hearing and ear canal normal. A middle ear effusion is present.     Mouth/Throat:     Lips: Pink.     Mouth: Mucous membranes are moist.     Pharynx: Oropharynx is clear. Uvula midline. No pharyngeal swelling, oropharyngeal exudate, posterior oropharyngeal erythema, uvula swelling or postnasal drip.  Cardiovascular:     Rate and Rhythm: Normal rate and regular rhythm.     Pulses: Normal pulses.          Radial pulses are 2+ on the right side and 2+ on the left side.     Heart sounds: Normal heart sounds. No murmur heard.    No friction rub. No gallop.  Pulmonary:     Effort: Pulmonary effort is normal.     Breath sounds: Normal breath sounds. No decreased air movement.  No decreased breath sounds, wheezing, rhonchi or rales.  Musculoskeletal:     Cervical back: Normal range of motion and neck supple.  Lymphadenopathy:     Head:     Right side of head: No submental, submandibular or preauricular adenopathy.     Left side of head: No submental, submandibular or preauricular adenopathy.     Cervical:     Right cervical: No superficial cervical adenopathy.    Left cervical: No superficial cervical adenopathy.     Upper Body:     Right upper body: No supraclavicular adenopathy.     Left upper body: No supraclavicular adenopathy.  Skin:    General: Skin is warm and dry.  Neurological:     General: No focal deficit present.     Mental Status: She is alert and oriented to person, place, and time.  Psychiatric:        Mood and Affect: Mood normal.        Behavior: Behavior normal. Behavior is cooperative.        Thought Content: Thought content normal.        Judgment: Judgment normal.       Results for orders placed or performed in visit on 08/23/23  POCT Influenza A/B  Result Value Ref Range   Influenza A, POC Negative Negative   Influenza B, POC Negative Negative    Assessment & Plan      No follow-ups on file.      Problem List Items Addressed This Visit   None Visit Diagnoses       Viral upper respiratory tract infection    -  Primary     Flu-like symptoms       Relevant Orders   POCT Influenza A/B (Completed)   Novel Coronavirus, NAA ([facility])      Assessment and Plan    Viral Upper Respiratory Infection (URI) Presents with chills, fever (102.46F), congestion, body aches, headache, and throat tickling. Symptoms began acutely, consistent with a viral etiology, likely a common cold. Daughter had similar symptoms that resolved quickly. Currently mild symptoms without significant cough, shortness of breath, or gastrointestinal issues. Physical exam shows slight fluid in ears, clear lungs. Single dose of pediatric Motrin reduced fever.  Discussed typical viral infection course (3-10 days) and potential lingering cough. Advised to monitor for worsening symptoms (trouble breathing, persistent fever, severe sinus pressure, confusion) which would necessitate emergency care. Informed that symptoms persisting beyond 7-10 days may indicate secondary bacterial infection. - Symptomatic treatment with over-the-counter medications: Mucinex, Tylenol, Robitussin. - Use Flonase for nasal congestion. - Avoid decongestants due to potential hypertension. - Educated on common cold duration (3-10  days) and possible lingering cough. - Seek medical attention if symptoms worsen (trouble breathing, persistent fever, severe sinus pressure, confusion). - Follow up if symptoms persist beyond 7-10 days to evaluate for secondary bacterial infection.        No follow-ups on file.   I, Karlis Cregg E Frankye Schwegel, PA-C, have reviewed all documentation for this visit. The documentation on 08/23/23 for the exam, diagnosis, procedures, and orders are all accurate and complete.   Jacquelin Hawking, MHS, PA-C [facility]

## 2023-08-23 NOTE — Patient Instructions (Signed)
VISIT SUMMARY:  You came in today with symptoms that started suddenly, including chills, a high fever, body aches, a headache, congestion, and a tickling sensation in your throat. Your fever has gone down, but you still have congestion and some mild throat irritation. You mentioned that your daughter had similar symptoms recently, but your partner has not shown any signs of illness. You have been using Motrin to manage your fever, which has helped.  YOUR PLAN:  -VIRAL UPPER RESPIRATORY INFECTION (URI): A viral upper respiratory infection, commonly known as a cold, is caused by a virus and typically includes symptoms like fever, congestion, body aches, and a headache. Your symptoms are consistent with this diagnosis. We recommend continuing with over-the-counter medications such as Mucinex, Tylenol, and Robitussin to manage your symptoms. You can also use Flonase for nasal congestion but avoid decongestants due to potential high blood pressure. Most colds last between 3 to 10 days, but a cough may linger. Please monitor your symptoms and seek medical attention if you experience trouble breathing, a persistent fever, severe sinus pressure, or confusion. If your symptoms last beyond 7-10 days, follow up with [place] to check for a possible bacterial infection. Your flu testing was negative. Your COVID test has to be sent out and the results will be available on Mychart in the next few days.   INSTRUCTIONS:  Please follow up if your symptoms persist beyond 7-10 days to evaluate for a possible secondary bacterial infection. Seek immediate medical attention if you experience trouble breathing, a persistent fever, severe sinus pressure, or confusion.

## 2023-08-28 ENCOUNTER — Encounter: Payer: Self-pay | Admitting: Physician Assistant

## 2023-08-28 LAB — NOVEL CORONAVIRUS, NAA: SARS-CoV-2, NAA: DETECTED — AB

## 2023-08-28 NOTE — Progress Notes (Signed)
Your COVID testing was positive. Since your symptoms have been ongoing for more than 5 days, you are outside the therapeutic window to start COVID antivirals. Please continue with symptomatic management and let us know if your symptoms are getting worse.

## 2023-09-23 ENCOUNTER — Ambulatory Visit: Payer: Self-pay

## 2023-09-23 ENCOUNTER — Ambulatory Visit: Payer: 59 | Admitting: Family Medicine

## 2023-09-23 VITALS — BP 124/88 | HR 94 | Temp 98.2°F | Ht 61.0 in | Wt 155.0 lb

## 2023-09-23 DIAGNOSIS — J069 Acute upper respiratory infection, unspecified: Secondary | ICD-10-CM

## 2023-09-23 NOTE — Telephone Encounter (Signed)
  Chief Complaint: cough, green nasal discharge Symptoms: low grade fever Frequency: Friday Pertinent Negatives: Patient denies SOB Disposition: [] ED /[] Urgent Care (no appt availability in office) / [x] Appointment(In office/virtual)/ []  [facility]/ [] Home Care/ [] Refused Recommended Disposition /[] [facility]/ []  Follow-up with PCP Additional Notes:  Reason for Disposition  Earache  Answer Assessment - Initial Assessment Questions 1. ONSET: "When did the nasal discharge start?"      Friday  2. AMOUNT: "How much discharge is there?"      Green drainage 3. COUGH: "Do you have a cough?" If Yes, ask: "Describe the color of your sputum" (clear, white, yellow, green)     Yes- clear 4. RESPIRATORY DISTRESS: "Describe your breathing."      normal 5. FEVER: "Do you have a fever?" If Yes, ask: "What is your temperature, how was it measured, and when did it start?"     99.4 6. SEVERITY: "Overall, how bad are you feeling right now?" (e.g., doesn't interfere with normal activities, staying home from school/work, staying in bed)      Staying home from work  7. OTHER SYMPTOMS: "Do you have any other symptoms?" (e.g., sore throat, earache, wheezing, vomiting)     Earache, fever, congestion, runny nose  8. PREGNANCY: "Is there any chance you are pregnant?" "When was your last menstrual period?"     N/a  Protocols used: Common Cold-A-AH

## 2023-09-23 NOTE — Progress Notes (Signed)
 Established patient visit   Patient: Erin Vance   DOB: 1981-07-21   43 y.o. Female  MRN: [id number] Visit Date: 09/23/2023  Today's healthcare provider: Mila Merry, MD   Chief Complaint  Patient presents with   Cough    Patient has had 4-5 days cough, congestion, possible fever and fatigue.  Nasal congestion of green mucus.  Her 9 year old daughter tested positive for RSV today.  Patient was Covid positive 2 months ago.   Subjective    Discussed the use of AI scribe software for clinical note transcription with the patient, who gave verbal consent to proceed.  History of Present Illness   Erin Vance is a 43 year old female who presents with symptoms of an upper respiratory infection.  She has been experiencing symptoms since Friday evening, approximately three days ago, starting with fatigue, malaise, and a burning sensation in her nose, which progressed to significant nasal congestion. By late Saturday evening into early Sunday morning, she developed a persistent, though not constant, cough described as unpleasant when it occurs.  She reports fever, chills, and sweats. There is no significant sore throat, though mild throat discomfort occurs after coughing. No ear pain is present, but she notes a sensation of congestion in her ears, particularly after blowing her nose, which caused a whistling sound in one ear. She denies significant pain or pressure around her eyes and sinuses today, though these symptoms were more pronounced yesterday.  She experiences shortness of breath primarily when anticipating a cough, which sometimes leads to coughing. She does not experience shortness of breath with activities such as walking up stairs.  Her three-year-old daughter recently tested positive for RSV after initially being diagnosed with an upper respiratory infection. She had COVID-19 in December, approximately two months ago. She works at an [employer],  where various illnesses, including stomach bugs and COVID-19, have been prevalent.        Medications: Outpatient Medications Prior to Visit  Medication Sig   ALPRAZolam (XANAX) 0.5 MG tablet Take 1 tablet (0.5 mg total) by mouth 3 (three) times daily as needed for anxiety.   norelgestromin-ethinyl estradiol Burr Medico) 150-35 MCG/24HR transdermal patch Place 1 patch onto the skin once a week.   No facility-administered medications prior to visit.   Review of Systems     Objective    BP 124/88   Pulse 94   Temp 98.2 F (36.8 C) (Oral)   Ht 5\' 1"  (1.549 m)   Wt 155 lb (70.3 kg)   SpO2 99%   BMI 29.29 kg/m   Physical Exam   General: Appearance:    Well developed, well nourished female in no acute distress  Eyes:    PERRL, conjunctiva/corneas clear, EOM's intact       Lungs:     Occasional expiratory wheezes, no rales or rhonchi, respirations unlabored  Heart:    Normal heart rate. Normal rhythm. No murmurs, rubs, or gallops.    MS:   All extremities are intact.    Neurologic:   Awake, alert, oriented x 3. No apparent focal neurological defect.         Assessment & Plan       Upper Respiratory Infection (likely RSV) Symptoms include congestion, cough, fever, and ear fullness. No signs of lower respiratory involvement or bacterial infection. Recent exposure to RSV from daughter. -Self-care measures including rest and hydration. -Consider over-the-counter Sudafed if tolerated, or saline nasal spray for congestion. -Contact office  if symptoms worsen or do not improve by end of the week.          Mila Merry, MD  [facility] [phone] (phone) [phone] (fax)  [facility]

## 2024-02-12 ENCOUNTER — Other Ambulatory Visit: Payer: Self-pay | Admitting: Otolaryngology

## 2024-02-12 DIAGNOSIS — R1310 Dysphagia, unspecified: Secondary | ICD-10-CM

## 2024-02-29 ENCOUNTER — Encounter: Payer: Self-pay | Admitting: Family Medicine

## 2024-03-02 ENCOUNTER — Other Ambulatory Visit: Payer: Self-pay

## 2024-03-02 DIAGNOSIS — F4323 Adjustment disorder with mixed anxiety and depressed mood: Secondary | ICD-10-CM

## 2024-03-02 MED ORDER — ALPRAZOLAM 0.5 MG PO TABS: 0.5000 mg | ORAL_TABLET | Freq: Three times a day (TID) | ORAL | 0 refills | Status: DC | PRN

## 2024-03-10 ENCOUNTER — Other Ambulatory Visit

## 2024-03-12 ENCOUNTER — Ambulatory Visit
Admission: RE | Admit: 2024-03-12 | Discharge: 2024-03-12 | Disposition: A | Discharge status: Home / Self Care | Source: Ambulatory Visit | Attending: Otolaryngology | Admitting: Otolaryngology

## 2024-03-12 DIAGNOSIS — R1310 Dysphagia, unspecified: Secondary | ICD-10-CM

## 2024-03-25 ENCOUNTER — Ambulatory Visit
Admission: RE | Admit: 2024-03-25 | Discharge: 2024-03-25 | Disposition: A | Discharge status: Home / Self Care | Attending: Gastroenterology | Admitting: Gastroenterology

## 2024-03-25 ENCOUNTER — Other Ambulatory Visit: Payer: Self-pay

## 2024-03-25 ENCOUNTER — Ambulatory Visit: Admitting: Anesthesiology

## 2024-03-25 ENCOUNTER — Encounter
Admission: RE | Disposition: A | Discharge status: Home / Self Care | Payer: Self-pay | Source: Home / Self Care | Attending: Gastroenterology

## 2024-03-25 ENCOUNTER — Encounter: Payer: Self-pay | Admitting: Gastroenterology

## 2024-03-25 DIAGNOSIS — K2289 Other specified disease of esophagus: Secondary | ICD-10-CM | POA: Diagnosis not present

## 2024-03-25 DIAGNOSIS — K200 Eosinophilic esophagitis: Secondary | ICD-10-CM | POA: Insufficient documentation

## 2024-03-25 DIAGNOSIS — Z5986 Financial insecurity: Secondary | ICD-10-CM | POA: Insufficient documentation

## 2024-03-25 DIAGNOSIS — Z5941 Food insecurity: Secondary | ICD-10-CM | POA: Diagnosis not present

## 2024-03-25 DIAGNOSIS — K222 Esophageal obstruction: Secondary | ICD-10-CM | POA: Diagnosis not present

## 2024-03-25 DIAGNOSIS — R1310 Dysphagia, unspecified: Secondary | ICD-10-CM | POA: Diagnosis present

## 2024-03-25 HISTORY — PX: ESOPHAGOGASTRODUODENOSCOPY: SHX5428

## 2024-03-25 HISTORY — PX: ESOPHAGOSCOPY WITH DILITATION: SHX5618

## 2024-03-25 LAB — POCT PREGNANCY, URINE: Preg Test, Ur: NEGATIVE

## 2024-03-25 SURGERY — EGD (ESOPHAGOGASTRODUODENOSCOPY)
Anesthesia: General

## 2024-03-25 MED ORDER — DEXMEDETOMIDINE HCL IN NACL 80 MCG/20ML IV SOLN
INTRAVENOUS | Status: DC | PRN
  Administered 2024-03-25: 8 ug via INTRAVENOUS

## 2024-03-25 MED ORDER — LIDOCAINE HCL (CARDIAC) PF 100 MG/5ML IV SOSY
PREFILLED_SYRINGE | INTRAVENOUS | Status: DC | PRN
  Administered 2024-03-25: 60 mg via INTRAVENOUS

## 2024-03-25 MED ORDER — SODIUM CHLORIDE 0.9 % IV SOLN
INTRAVENOUS | Status: DC
  Administered 2024-03-25: 500 mL via INTRAVENOUS

## 2024-03-25 MED ORDER — PROPOFOL 500 MG/50ML IV EMUL
INTRAVENOUS | Status: DC | PRN
  Administered 2024-03-25: 150 ug/kg/min via INTRAVENOUS

## 2024-03-25 MED ORDER — PROPOFOL 10 MG/ML IV BOLUS
INTRAVENOUS | Status: DC | PRN
  Administered 2024-03-25: 50 mg via INTRAVENOUS
  Administered 2024-03-25: 100 mg via INTRAVENOUS

## 2024-03-25 MED ORDER — PROPOFOL 10 MG/ML IV BOLUS
INTRAVENOUS | Status: AC
  Filled 2024-03-25: qty 20

## 2024-03-25 NOTE — Transfer of Care (Signed)
 Immediate Anesthesia Transfer of Care Note  Patient: Erin Vance  Procedure(s) Performed: EGD (ESOPHAGOGASTRODUODENOSCOPY) ESOPHAGOSCOPY, WITH DILATION  Patient Location: PACU and Endoscopy Unit  Anesthesia Type:MAC  Level of Consciousness: drowsy  Airway & Oxygen Therapy: Patient Spontanous Breathing  Post-op Assessment: Report given to RN and Post -op Vital signs reviewed and stable  Post vital signs: Reviewed and stable  Last Vitals:  Vitals Value Taken Time  BP    Temp    Pulse    Resp    SpO2      Last Pain:  Vitals:   03/25/24 0911  TempSrc: Tympanic  PainSc: 0-No pain      Patients Stated Pain Goal: 9 (03/25/24 0911)  Complications: No notable events documented.

## 2024-03-25 NOTE — H&P (Signed)
 Ruel Kung , MD [address], [place], [place], [address] Phone: [phone] Fax: [phone]  Primary Care Physician:  Donzella Lauraine SAILOR, DO   Pre-Procedure History & Physical: HPI:  Fawnda Vitullo is a 43 y.o. female is here for an endoscopy    Past Medical History:  Diagnosis Date   Anxiety     Past Surgical History:  Procedure Laterality Date   NO PAST SURGERIES      Prior to Admission medications   Medication Sig Start Date End Date Taking? Authorizing Provider  ALPRAZolam  (XANAX ) 0.5 MG tablet Take 1 tablet (0.5 mg total) by mouth 3 (three) times daily as needed for anxiety. Keep scheduled appointment 03/27/24 03/02/24   Donzella Lauraine SAILOR, DO  norelgestromin -ethinyl estradiol  (XULANE) 150-35 MCG/24HR transdermal patch Place 1 patch onto the skin once a week. Patient not taking: Reported on 03/25/2024 06/28/23   Emilio Marseille T, FNP    Allergies as of 03/18/2024 - Review Complete 09/23/2023  Allergen Reaction Noted   Cephalexin  01/11/2015    Family History  Problem Relation Age of Onset   Hypertension Mother    Kidney cancer Mother 59       Kidney removal   Valvular heart disease Mother    Heart attack Father    Diabetes Sister    Bell's palsy Sister    Congestive Heart Failure Sister    ALS Maternal Uncle    Dementia Maternal Grandmother    Breast cancer Paternal Grandmother    Heart attack Paternal Grandfather     Social History   Socioeconomic History   Marital status: Significant Other    Spouse name: Not on file   Number of children: Not on file   Years of education: Not on file   Highest education level: Master's degree (e.g., MA, MS, MEng, MEd, MSW, MBA)  Occupational History   Not on file  Tobacco Use   Smoking status: Never   Smokeless tobacco: Never  Vaping Use   Vaping status: Never Used  Substance and Sexual Activity   Alcohol use: Yes    Alcohol/week: 2.0 standard drinks of alcohol    Types: 2 Cans of beer per  week    Comment: once a month   Drug use: No   Sexual activity: Yes    Birth control/protection: None  Other Topics Concern   Not on file  Social History Narrative   Not on file   Social Drivers of Health   Financial Resource Strain: Medium Risk (03/24/2024)   Overall Financial Resource Strain (CARDIA)    Difficulty of Paying Living Expenses: Somewhat hard  Food Insecurity: No Food Insecurity (03/24/2024)   Hunger Vital Sign    Worried About Running Out of Food in the Last Year: Never true    Ran Out of Food in the Last Year: Never true  Recent Concern: Food Insecurity - Food Insecurity Present (03/16/2024)   [facility]   Hunger Vital Sign    Within the past 12 months, you worried that your food would run out before you got the money to buy more.: Sometimes true    Within the past 12 months, the food you bought just didn't last and you didn't have money to get more.: Never true  Transportation Needs: No Transportation Needs (03/24/2024)   PRAPARE - Administrator, Civil Service (Medical): No    Lack of Transportation (Non-Medical): No  Physical Activity: Inactive (03/24/2024)  Exercise Vital Sign    Days of Exercise per Week: 0 days    Minutes of Exercise per Session: Not on file  Stress: Stress Concern Present (03/24/2024)   [employer] of Occupational Health - Occupational Stress Questionnaire    Feeling of Stress: Rather much  Social Connections: Moderately Integrated (03/24/2024)   Social Connection and Isolation Panel    Frequency of Communication with Friends and Family: More than three times a week    Frequency of Social Gatherings with Friends and Family: Once a week    Attends Religious Services: More than 4 times per year    Active Member of [employer] or Organizations: Yes    Attends Banker Meetings: More than 4 times per year    Marital Status: Widowed  Catering manager Violence: Not on file    Review of  Systems: See HPI, otherwise negative ROS  Physical Exam: BP 128/84   Pulse 64   Temp (!) 96.7 F (35.9 C) (Tympanic)   Resp 13   Ht 5' 1 (1.549 m)   Wt 70.9 kg   LMP 03/17/2024 (Exact Date)   SpO2 98%   BMI 29.55 kg/m  General:   Alert,  pleasant and cooperative in NAD Head:  Normocephalic and atraumatic. Neck:  Supple; no masses or thyromegaly. Lungs:  Clear throughout to auscultation, normal respiratory effort.    Heart:  +S1, +S2, Regular rate and rhythm, No edema. Abdomen:  Soft, nontender and nondistended. Normal bowel sounds, without guarding, and without rebound.   Neurologic:  Alert and  oriented x4;  grossly normal neurologically.  Impression/Plan: Donald Darol Keels is here for an endoscopy  to be performed for  evaluation of dysphagia    Risks, benefits, limitations, and alternatives regarding endoscopy have been reviewed with the patient.  Questions have been answered.  All parties agreeable.   Ruel Kung, MD  03/25/2024, 9:21 AM

## 2024-03-25 NOTE — Anesthesia Preprocedure Evaluation (Addendum)
 Anesthesia Evaluation  Patient identified by MRN, date of birth, ID band Patient awake    Reviewed: Allergy & Precautions, NPO status , Patient's Chart, lab work & pertinent test results  History of Anesthesia Complications Negative for: history of anesthetic complications  Airway Mallampati: I   Neck ROM: Full    Dental  (+) Missing   Pulmonary neg pulmonary ROS   Pulmonary exam normal breath sounds clear to auscultation       Cardiovascular Exercise Tolerance: Good negative cardio ROS Normal cardiovascular exam Rhythm:Regular Rate:Normal     Neuro/Psych  PSYCHIATRIC DISORDERS Anxiety Depression    negative neurological ROS     GI/Hepatic negative GI ROS,,,  Endo/Other  negative endocrine ROS    Renal/GU negative Renal ROS     Musculoskeletal   Abdominal   Peds  Hematology negative hematology ROS (+)   Anesthesia Other Findings   Reproductive/Obstetrics                              Anesthesia Physical Anesthesia Plan  ASA: 2  Anesthesia Plan: General   Post-op Pain Management:    Induction: Intravenous  PONV Risk Score and Plan: 3 and Propofol  infusion, TIVA and Treatment may vary due to age or medical condition  Airway Management Planned: Natural Airway  Additional Equipment:   Intra-op Plan:   Post-operative Plan:   Informed Consent: I have reviewed the patients History and Physical, chart, labs and discussed the procedure including the risks, benefits and alternatives for the proposed anesthesia with the patient or authorized representative who has indicated his/her understanding and acceptance.       Plan Discussed with: CRNA  Anesthesia Plan Comments: (LMA/GETA backup discussed.  Patient consented for risks of anesthesia including but not limited to:  - adverse reactions to medications - damage to eyes, teeth, lips or other oral mucosa - nerve damage due to  positioning  - sore throat or hoarseness - damage to heart, brain, nerves, lungs, other parts of body or loss of life  Informed patient about role of CRNA in peri- and intra-operative care.  Patient voiced understanding.)         Anesthesia Quick Evaluation

## 2024-03-25 NOTE — Op Note (Signed)
 [facility] Gastroenterology Patient Name: Erin Vance Procedure Date: 03/25/2024 9:53 AM MRN: [id number] Account #: [id number] Date of Birth: 1980-12-14 Admit Type: Outpatient Age: 43 Room: [facility] ENDO ROOM 3 Gender: Female Note Status: Finalized Instrument Name: Barnie GI Scope [id number] Procedure:             Upper GI endoscopy Indications:           Dysphagia Providers:             Ruel Kung MD, MD Referring MD:          Lauraine GEANNIE Buoy (Referring MD) Medicines:             Monitored Anesthesia Care Complications:         No immediate complications. Procedure:             Pre-Anesthesia Assessment:                        - Prior to the procedure, a History and Physical was                         performed, and patient medications, allergies and                         sensitivities were reviewed. The patient's tolerance                         of previous anesthesia was reviewed.                        - The risks and benefits of the procedure and the                         sedation options and risks were discussed with the                         patient. All questions were answered and informed                         consent was obtained.                        - ASA Grade Assessment: II - A patient with mild                         systemic disease.                        After obtaining informed consent, the endoscope was                         passed under direct vision. Throughout the procedure,                         the patient's blood pressure, pulse, and oxygen                         saturations were monitored continuously. The Endoscope                         was introduced  through the mouth, and advanced to the                         third part of duodenum. The upper GI endoscopy was                         accomplished with ease. The patient tolerated the                         procedure well. Findings:      One benign-appearing,  intrinsic severe (stenosis; an endoscope cannot       pass) stenosis was found 20 cm from the incisors. This stenosis measured       9 mm (inner diameter). The stenosis was traversed after dilation. A TTS       dilator was passed through the scope. Dilation with an 03-14-09 mm balloon       and a 05-17-11 mm balloon dilator was performed to 12 mm. The dilation       site was examined and showed moderate mucosal disruption.      Mucosal changes including longitudinal furrows, circumferential folds,       stenosis and tight circumferential folds were found in the mid esophagus       and in the distal esophagus. Esophageal findings were graded using the       Eosinophilic Esophagitis Endoscopic Reference Score (EoE-EREFS) as:       Edema Grade 1 Present (decreased clarity or absence of vascular       markings), Rings Grade 1 Mild (subtle circumferential ridges seen on       esophageal distension), Exudates Grade 0 None (no white lesions seen),       Furrows Grade 1 Mild (vertical lines without visible depth) and       Stricture present (10 mm luminal diameter). Biopsies were taken with a       cold forceps for histology.      The cardia and gastric fundus were normal on retroflexion.      A medium-sized hiatal hernia was present. Impression:            - Benign-appearing esophageal stenosis. Dilated.                        - Esophageal mucosal changes suggestive of                         eosinophilic esophagitis. Biopsied. Recommendation:        - Discharge patient to home (with escort).                        - Resume previous diet.                        - Use Prilosec (omeprazole) 40 mg PO daily for 3 weeks.                        - Return to GI office in 4 weeks. Procedure Code(s):     --- Professional ---                        [phone], Esophagogastroduodenoscopy, flexible,  transoral; with transendoscopic balloon dilation of                         esophagus (less  than 30 mm diameter)                        43239, 59, Esophagogastroduodenoscopy, flexible,                         transoral; with biopsy, single or multiple Diagnosis Code(s):     --- Professional ---                        K22.2, Esophageal obstruction                        K22.89, Other specified disease of esophagus                        R13.10, Dysphagia, unspecified CPT copyright 2022 American Medical Association. All rights reserved. The codes documented in this report are preliminary and upon coder review may  be revised to meet current compliance requirements. Ruel Kung, MD Ruel Kung MD, MD 03/25/2024 10:17:18 AM This report has been signed electronically. Number of Addenda: 0 Note Initiated On: 03/25/2024 9:53 AM Estimated Blood Loss:  Estimated blood loss: none.      [facility]

## 2024-03-26 NOTE — Anesthesia Postprocedure Evaluation (Signed)
 Anesthesia Post Note  Patient: Malene Blaydes Chamorro  Procedure(s) Performed: EGD (ESOPHAGOGASTRODUODENOSCOPY) ESOPHAGOSCOPY, WITH DILATION  Patient location during evaluation: PACU Anesthesia Type: General Level of consciousness: awake and alert, oriented and patient cooperative Pain management: pain level controlled Vital Signs Assessment: post-procedure vital signs reviewed and stable Respiratory status: spontaneous breathing, nonlabored ventilation and respiratory function stable Cardiovascular status: blood pressure returned to baseline and stable Postop Assessment: adequate PO intake Anesthetic complications: no   No notable events documented.   Last Vitals:  Vitals:   03/25/24 1038 03/25/24 1054  BP: 125/81 (!) 139/90  Pulse: 65 64  Resp: 13 14  Temp:    SpO2: 99% 99%    Last Pain:  Vitals:   03/25/24 1054  TempSrc:   PainSc: 0-No pain                 Alfonso Ruths

## 2024-03-27 ENCOUNTER — Ambulatory Visit: Payer: Self-pay | Admitting: Family Medicine

## 2024-03-27 ENCOUNTER — Encounter: Payer: Self-pay | Admitting: Family Medicine

## 2024-03-27 ENCOUNTER — Encounter: Payer: BC Managed Care – PPO | Admitting: Family Medicine

## 2024-03-27 VITALS — BP 117/76 | HR 70 | Temp 98.2°F | Ht 61.0 in | Wt 154.8 lb

## 2024-03-27 DIAGNOSIS — Z9889 Other specified postprocedural states: Secondary | ICD-10-CM | POA: Diagnosis not present

## 2024-03-27 DIAGNOSIS — R202 Paresthesia of skin: Secondary | ICD-10-CM

## 2024-03-27 DIAGNOSIS — Z8719 Personal history of other diseases of the digestive system: Secondary | ICD-10-CM

## 2024-03-27 DIAGNOSIS — K449 Diaphragmatic hernia without obstruction or gangrene: Secondary | ICD-10-CM

## 2024-03-27 DIAGNOSIS — Z0000 Encounter for general adult medical examination without abnormal findings: Secondary | ICD-10-CM

## 2024-03-27 DIAGNOSIS — Z0001 Encounter for general adult medical examination with abnormal findings: Secondary | ICD-10-CM | POA: Diagnosis not present

## 2024-03-27 DIAGNOSIS — K200 Eosinophilic esophagitis: Secondary | ICD-10-CM | POA: Diagnosis not present

## 2024-03-27 DIAGNOSIS — R944 Abnormal results of kidney function studies: Secondary | ICD-10-CM

## 2024-03-27 DIAGNOSIS — Z23 Encounter for immunization: Secondary | ICD-10-CM | POA: Diagnosis not present

## 2024-03-27 DIAGNOSIS — F4323 Adjustment disorder with mixed anxiety and depressed mood: Secondary | ICD-10-CM

## 2024-03-27 DIAGNOSIS — Z136 Encounter for screening for cardiovascular disorders: Secondary | ICD-10-CM

## 2024-03-27 LAB — SURGICAL PATHOLOGY

## 2024-03-27 NOTE — Progress Notes (Signed)
 Complete physical exam   Patient: Erin Vance   DOB: 10/24/1980   43 y.o. Female  MRN: [id number] Visit Date: 03/27/2024  Today's healthcare provider: LAURAINE LOISE BUOY, DO   Chief Complaint  Patient presents with   Annual Exam    Diet- General Exercise- No Feeling- Erin Vance just had an upper endoscopy on Wednesday, waiting on biopsy results Sleep- Can fall asleep fine but several nights have a hard to falling back to sleep and then she will take a xanax  Concerns- None  Tdap Vaccine- yes   Subjective    [name] is a 43 y.o. female who presents today for a complete physical exam.   HPI HPI     Annual Exam    Additional comments: Diet- General Exercise- No Feeling- Okay just had an upper endoscopy on Wednesday, waiting on biopsy results Sleep- Can fall asleep fine but several nights have a hard to falling back to sleep and then she will take a xanax  Concerns- None  Tdap Vaccine- yes      Last edited by Terrel Powell CROME, CMA on 03/27/2024  8:23 AM.      [name] is a 44 year old female who presents for an annual physical exam and Tdap vaccination update.  She underwent an upper endoscopy on Wednesday, during which a biopsy of the stomach lining was performed. She does not recall speaking with the medical team post-procedure, and her sister only spoke with the nurse. She was informed that she was difficult to wake up from anesthesia, although she was discharged within an hour. This was her first experience with anesthesia.  Since the procedure, she has experienced discomfort described as a dull pain in the middle of her back, which she feels would be relieved by pressure. This discomfort was most notable on the day of the procedure and has been intermittent since then. She has not yet taken Prilosec today, but when she took the 40 mg doses she was advised to in the past two days, she experienced discomfort for the rest of the day.  She  denies experiencing heartburn and has not taken Tums since her pregnancy.  She occasionally experiences numbness in her fingers while sleeping, which she attributes to her sleeping position or possibly the effects of Xanax . 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 She previously took 0.25 mg but was recently prescribed 0.5 mg, which she feels is too strong and prefers to return to the lower dose. She notes that the higher dose leaves her feeling different in the morning.  Her diet includes burgers, chicken, pork, vegetables, and fruits, with an effort to avoid fast food due to time constraints. She consumes one soda daily for caffeine and does not have a coffee machine at home. She used to be very active before having her daughter, who is now almost [age] years old, but finds it challenging to exercise regularly due to time constraints.    Past Medical History:  Diagnosis Date   Allergy Before 2000   Cephalexin   Anxiety    Past Surgical History:  Procedure Laterality Date   ESOPHAGOGASTRODUODENOSCOPY N/A 03/25/2024   Procedure: EGD (ESOPHAGOGASTRODUODENOSCOPY);  Surgeon: Therisa Bi, MD;  Location: [facility] ENDOSCOPY;  Service: Gastroenterology;  Laterality: N/A;   ESOPHAGOSCOPY WITH DILITATION  03/25/2024   Procedure: ESOPHAGOSCOPY, WITH DILATION;  Surgeon: Therisa Bi, MD;  Location: [facility] ENDOSCOPY;  Service: Gastroenterology;;   NO PAST SURGERIES     Social History   Socioeconomic History   Marital status: Significant Other    Spouse name: Not on file   Number of children: Not on file   Years of education: Not on file   Highest education level: Master's degree (e.g., MA, MS, MEng, MEd, MSW, MBA)  Occupational History   Not on file  Tobacco Use   Smoking status: Never   Smokeless tobacco: Never  Vaping Use   Vaping status: Never Used  Substance and Sexual Activity   Alcohol use:  Yes    Alcohol/week: 2.0 standard drinks of alcohol    Types: 2 Cans of beer per week    Comment: Once a week   Drug use: No   Sexual activity: Yes    Birth control/protection: None  Other Topics Concern   Not on file  Social History Narrative   Not on file   Social Drivers of Health   Financial Resource Strain: Medium Risk (03/24/2024)   Overall Financial Resource Strain (CARDIA)    Difficulty of Paying Living Expenses: Somewhat hard  Food Insecurity: No Food Insecurity (03/24/2024)   Hunger Vital Sign    Worried About Running Out of Food in the Last Year: Never true    Ran Out of Food in the Last Year: Never true  Recent Concern: Food Insecurity - Food Insecurity Present (03/16/2024)   Received from [facility]   Hunger Vital Sign    Within the past 12 months, you worried that your food would run out before you got the money to buy more.: Sometimes true    Within the past 12 months, the food you bought just didn't last and you didn't have money to get more.: Never true  Transportation Needs: No Transportation Needs (03/24/2024)   PRAPARE - Administrator, Civil Service (Medical): No    Lack of Transportation (Non-Medical): No  Physical Activity: Inactive (03/24/2024)   Exercise Vital Sign    Days of Exercise per Week: 0 days    Minutes of Exercise per Session: Not on file  Stress: Stress Concern Present (03/24/2024)   [employer] of Occupational Health - Occupational Stress Questionnaire    Feeling of Stress: Rather much  Social Connections: Moderately Integrated (03/24/2024)   Social Connection and Isolation Panel    Frequency of Communication with Friends and Family: More than three times a week    Frequency of Social Gatherings with Friends and Family: Once a week    Attends Religious Services: More than 4 times per year    Active Member of [employer] or Organizations: Yes    Attends Banker Meetings: More than 4 times per year     Marital Status: Widowed  Intimate Partner Violence: Not At Risk (03/27/2024)   Humiliation, Afraid, Rape, and Kick questionnaire    Fear of Current or Ex-Partner: No    Emotionally Abused: No    Physically Abused: No    Sexually Abused: No   Family Status  Relation Name Status   Mother [place] Deceased   Father Al (prostate cancer) Alive   Sister 1 Alfonso Alive   Sister 2 Clarita Alive   Mat Uncle  Alive   MGM  Alive   PGM  (Not Specified)   PGF  Deceased  No partnership data on file   Family History  Problem Relation Age of Onset   Hypertension Mother  Kidney cancer Mother 61       Kidney removal   Valvular heart disease Mother    Heart attack Father    Cancer Father    Heart disease Father    Diabetes Sister    Heart disease Sister    Bell's palsy Sister    Congestive Heart Failure Sister    Diabetes Sister    ALS Maternal Uncle    Dementia Maternal Grandmother    Breast cancer Paternal Grandmother    Heart attack Paternal Grandfather    Allergies  Allergen Reactions   Cephalexin     Patient Care Team: Keina Mutch N, DO as PCP - General (Family Medicine)   Medications: Outpatient Medications Prior to Visit  Medication Sig   ALPRAZolam  (XANAX ) 0.5 MG tablet Take 1 tablet (0.5 mg total) by mouth 3 (three) times daily as needed for anxiety. Keep scheduled appointment 03/27/24   omeprazole (PRILOSEC) 40 MG capsule Take 40 mg by mouth daily.   [DISCONTINUED] norelgestromin -ethinyl estradiol  (XULANE) 150-35 MCG/24HR transdermal patch Place 1 patch onto the skin once a week. (Patient not taking: Reported on 03/25/2024)   No facility-administered medications prior to visit.    Review of Systems  Constitutional:  Negative for chills, fatigue and fever.  HENT:  Negative for congestion, ear pain, rhinorrhea, sneezing and sore throat.   Eyes: Negative.  Negative for pain and redness.  Respiratory:  Negative for cough, shortness of breath and wheezing.    Cardiovascular:  Negative for chest pain and leg swelling.  Gastrointestinal:  Negative for abdominal pain, blood in stool, constipation, diarrhea and nausea.  Endocrine: Negative for polydipsia and polyphagia.  Genitourinary: Negative.  Negative for dysuria, flank pain, hematuria, pelvic pain, vaginal bleeding and vaginal discharge.  Musculoskeletal:  Negative for arthralgias, back pain, gait problem and joint swelling.  Skin:  Negative for rash.  Neurological: Negative.  Negative for dizziness, tremors, seizures, weakness, light-headedness, numbness and headaches.  Hematological:  Negative for adenopathy.  Psychiatric/Behavioral: Negative.  Negative for behavioral problems, confusion and dysphoric mood. The patient is not nervous/anxious and is not hyperactive.       Objective    BP 117/76 (BP Location: Right Arm, Patient Position: Sitting, Cuff Size: Normal)   Pulse 70   Temp 98.2 F (36.8 C) (Oral)   Ht 5' 1 (1.549 m)   Wt 154 lb 12.8 oz (70.2 kg)   LMP 03/17/2024 (Exact Date)   SpO2 100%   BMI 29.25 kg/m    Physical Exam Vitals and nursing note reviewed.  Constitutional:      General: She is awake.     Appearance: Normal appearance.  HENT:     Head: Normocephalic and atraumatic.     Right Ear: Tympanic membrane, ear canal and external ear normal.     Left Ear: Tympanic membrane, ear canal and external ear normal.     Nose: Nose normal.     Mouth/Throat:     Mouth: Mucous membranes are moist.     Pharynx: Oropharynx is clear. No oropharyngeal exudate or posterior oropharyngeal erythema.  Eyes:     General: No scleral icterus.    Extraocular Movements: Extraocular movements intact.     Conjunctiva/sclera: Conjunctivae normal.     Pupils: Pupils are equal, round, and reactive to light.  Neck:     Thyroid: No thyromegaly or thyroid tenderness.  Cardiovascular:     Rate and Rhythm: Normal rate and regular rhythm.     Pulses: Normal pulses.  Heart sounds: Normal  heart sounds.  Pulmonary:     Effort: Pulmonary effort is normal. No tachypnea, bradypnea or respiratory distress.     Breath sounds: Normal breath sounds. No stridor. No wheezing, rhonchi or rales.  Abdominal:     General: Bowel sounds are normal. There is no distension.     Palpations: Abdomen is soft. There is no mass.     Tenderness: There is no abdominal tenderness. There is no guarding.     Hernia: No hernia is present.  Musculoskeletal:     Cervical back: Normal range of motion and neck supple.     Right lower leg: No edema.     Left lower leg: No edema.  Lymphadenopathy:     Cervical: No cervical adenopathy.  Skin:    General: Skin is warm and dry.  Neurological:     Mental Status: She is alert and oriented to person, place, and time. Mental status is at baseline.  Psychiatric:        Mood and Affect: Mood normal.        Behavior: Behavior normal.      Last depression screening scores    03/27/2024    8:26 AM 06/28/2023    2:30 PM 03/15/2023   10:11 AM  PHQ 2/9 Scores  PHQ - 2 Score 1 0 0  PHQ- 9 Score 6 1 0   Last fall risk screening    03/27/2024    8:26 AM  Fall Risk   Falls in the past year? 0  Number falls in past yr: 0  Injury with Fall? 0  Risk for fall due to : No Fall Risks   Last Audit-C alcohol use screening    03/24/2024    4:47 PM  Alcohol Use Disorder Test (AUDIT)  1. How often do you have a drink containing alcohol? 2  2. How many drinks containing alcohol do you have on a typical day when you are drinking? 0  3. How often do you have six or more drinks on one occasion? 0  AUDIT-C Score 2      Patient-reported   A score of 3 or more in women, and 4 or more in men indicates increased risk for alcohol abuse, EXCEPT if all of the points are from question 1   No results found for any visits on 03/27/24.  Assessment & Plan    Routine Health Maintenance and Physical Exam  Exercise Activities and Dietary recommendations  Goals   None      Immunization History  Administered Date(s) Administered   DTaP 09/28/1980, 11/26/1980, 02/17/1981, 06/22/1982   HPV Quadrivalent 07/12/2006, 09/19/2006, 01/22/2007   Hepatitis B 10/27/1997, 11/29/1997, 08/17/1998   IPV 09/28/1980, 11/26/1980, 06/22/1982, 03/04/1986   Influenza Split 06/28/2012   Influenza,inj,Quad PF,6+ Mos 04/20/2016, 06/30/2018, 08/13/2019   MMR 06/22/1982, 12/07/1998   Td 12/07/1998   Td (Adult),unspecified 01/20/2013   Tdap 07/12/2006, 01/20/2013, 03/27/2024    Health Maintenance  Topic Date Due   COVID-19 Vaccine (1 - 2024-25 season) 04/12/2024 (Originally 04/07/2023)   INFLUENZA VACCINE  11/03/2024 (Originally 03/06/2024)   Cervical Cancer Screening (HPV/Pap Cotest)  06/27/2028   DTaP/Tdap/Td (10 - Td or Tdap) 03/27/2034   Hepatitis B Vaccines 19-59 Average Risk  Completed   HPV VACCINES  Completed   Hepatitis C Screening  Completed   HIV Screening  Completed   Pneumococcal Vaccine  Aged Out   Meningococcal B Vaccine  Aged Out    Discussed health benefits of physical  activity, and encouraged her to engage in regular exercise appropriate for her age and condition.   Annual physical exam  Status post dilatation of esophageal stricture  Hiatal hernia  Eosinophilic esophagitis  Situational mixed anxiety and depressive disorder  Decreased glomerular filtration rate (GFR) -     Comprehensive metabolic panel with GFR  Encounter for screening for cardiovascular disorders -     Lipid panel  Paresthesia -     VITAMIN D 25 Hydroxy (Vit-D Deficiency, Fractures) -     Vitamin B12  Need for Tdap vaccination -     Tdap vaccine greater than or equal to 7yo IM      Annual physical exam Routine wellness visit with lifestyle modification discussion, emphasizing exercise and reduced soda intake. Physical exam overall unremarkable except as noted above. Routine lab work ordered as noted. Highlighted coffee (if plain) benefits over soda (Mt. Dew). -  Administer Tdap vaccine. - Perform blood work. - Encourage regular exercise, such as parking further away and doing squats. - Advise reducing soda intake and consider alternatives like flavored water or coffee.  Esophageal stricture; recent endoscopy; hiatal hernia; eosinophilic esophagitis Recent endoscopy with biopsy and esophageal stricture status postdilatation.  Esophageal mucosa noted to appear consistent with eosinophilic esophagitis, for which biopsies were taken -biopsy was consistent with eosinophilic esophagitis.  Moderate hiatal hernia noted.  Intermittent dull back pain possibly related to esophageal dilatation versus silent reflux. Has not taken prescribed Prilosec consistently. - Advised taking Prilosec (omeprazole) daily-as prescribed by gastroenterology. - Keep follow-up appointment with gastroenterology; defer to specialist management.  Situational mixed anxiety and depression Experiencing stress-related symptoms, prefers lower Xanax  dose due to morning grogginess. - Prescribe Xanax  0.25 mg as needed. - Advise breaking current 0.5 mg tablets in half until new prescription is filled.    Return in about 6 months (around 09/27/2024) for Anx/Dep mid-year check.     I discussed the assessment and treatment plan with the patient  The patient was provided an opportunity to ask questions and all were answered. The patient agreed with the plan and demonstrated an understanding of the instructions.   The patient was advised to call back or seek an in-person evaluation if the symptoms worsen or if the condition fails to improve as anticipated.    LAURAINE LOISE BUOY, DO  [facility] [phone] (phone) [phone] (fax)  [facility]

## 2024-03-28 LAB — COMPREHENSIVE METABOLIC PANEL WITH GFR
ALT: 33 IU/L — ABNORMAL HIGH (ref 0–32)
AST: 30 IU/L (ref 0–40)
Albumin: 4.4 g/dL (ref 3.9–4.9)
Alkaline Phosphatase: 57 IU/L (ref 44–121)
BUN/Creatinine Ratio: 9 (ref 9–23)
BUN: 9 mg/dL (ref 6–24)
Bilirubin Total: 0.4 mg/dL (ref 0.0–1.2)
CO2: 22 mmol/L (ref 20–29)
Calcium: 9.5 mg/dL (ref 8.7–10.2)
Chloride: 103 mmol/L (ref 96–106)
Creatinine, Ser: 0.96 mg/dL (ref 0.57–1.00)
Globulin, Total: 2.5 g/dL (ref 1.5–4.5)
Glucose: 96 mg/dL (ref 70–99)
Potassium: 4.5 mmol/L (ref 3.5–5.2)
Sodium: 140 mmol/L (ref 134–144)
Total Protein: 6.9 g/dL (ref 6.0–8.5)
eGFR: 75 mL/min/1.73 (ref >59)

## 2024-03-28 LAB — LIPID PANEL
Chol/HDL Ratio: 2.3 ratio (ref 0.0–4.4)
Cholesterol, Total: 173 mg/dL (ref 100–199)
HDL: 75 mg/dL (ref >39)
LDL Chol Calc (NIH): 86 mg/dL (ref 0–99)
Triglycerides: 61 mg/dL (ref 0–149)
VLDL Cholesterol Cal: 12 mg/dL (ref 5–40)

## 2024-03-28 LAB — VITAMIN B12: Vitamin B-12: 672 pg/mL (ref 232–1245)

## 2024-03-28 LAB — VITAMIN D 25 HYDROXY (VIT D DEFICIENCY, FRACTURES): Vit D, 25-Hydroxy: 29.5 ng/mL — ABNORMAL LOW (ref 30.0–100.0)

## 2024-03-31 ENCOUNTER — Ambulatory Visit: Payer: Self-pay | Admitting: Family Medicine

## 2024-04-21 ENCOUNTER — Encounter: Payer: Self-pay | Admitting: Family Medicine

## 2024-04-21 DIAGNOSIS — Z8719 Personal history of other diseases of the digestive system: Secondary | ICD-10-CM

## 2024-05-12 NOTE — Progress Notes (Unsigned)
 New Patient Note  RE: Erin Vance MRN: [id number] DOB: 17-Oct-1980 Date of Office Visit: 05/13/2024  Consult requested by: Donzella Lauraine SAILOR, DO Primary care provider: Donzella Lauraine SAILOR, DO  Chief Complaint: No chief complaint on file.  History of Present Illness: I had the pleasure of seeing Erin Vance for initial evaluation at the [facility] on 05/13/2024. She is a 43 y.o. female, who is referred here by Donzella Lauraine SAILOR, DO for the evaluation of food allergies.  Discussed the use of AI scribe software for clinical note transcription with the patient, who gave verbal consent to proceed.  History of Present Illness             Referral note: Patient's record indicates suspicion that esophageal stricture was related to food allergies. Patient requesting testing for food allergies.  Assessment and Plan: Erin Vance is a 43 y.o. female with: ***  Assessment and Plan               No follow-ups on file.  No orders of the defined types were placed in this encounter.  Lab Orders  No laboratory test(s) ordered today    Other allergy screening: Asthma: {Blank single:19197::yes,no} Rhino conjunctivitis: {Blank single:19197::yes,no} Food allergy: {Blank single:19197::yes,no} Medication allergy: {Blank single:19197::yes,no} Hymenoptera allergy: {Blank single:19197::yes,no} Urticaria: {Blank single:19197::yes,no} Eczema:{Blank single:19197::yes,no} History of recurrent infections suggestive of immunodeficency: {Blank single:19197::yes,no}  Diagnostics: Spirometry:  Tracings reviewed. Her effort: {Blank single:19197::Good reproducible efforts.,It was hard to get consistent efforts and there is a question as to whether this reflects a maximal maneuver.,Poor effort, data can not be interpreted.} FVC: ***L FEV1: ***L, ***% predicted FEV1/FVC ratio: ***% Interpretation: {Blank single:19197::Spirometry  consistent with mild obstructive disease,Spirometry consistent with moderate obstructive disease,Spirometry consistent with severe obstructive disease,Spirometry consistent with possible restrictive disease,Spirometry consistent with mixed obstructive and restrictive disease,Spirometry uninterpretable due to technique,Spirometry consistent with normal pattern,No overt abnormalities noted given today's efforts}.  Please see scanned spirometry results for details.  Skin Testing: {Blank single:19197::Select foods,Environmental allergy panel,Environmental allergy panel and select foods,Food allergy panel,None,Deferred due to recent antihistamines use}. *** Results discussed with patient/family.   Past Medical History: Patient Active Problem List   Diagnosis Date Noted   Hiatal hernia 03/27/2024   Screening for cervical cancer 06/28/2023   Screening breast examination 06/28/2023   Birth control counseling 06/28/2023   Annual physical exam 03/15/2023   Situational mixed anxiety and depressive disorder 01/11/2015   Past Medical History:  Diagnosis Date   Allergy Before 2000   Cephalexin   Anxiety    Past Surgical History: Past Surgical History:  Procedure Laterality Date   ESOPHAGOGASTRODUODENOSCOPY N/A 03/25/2024   Procedure: EGD (ESOPHAGOGASTRODUODENOSCOPY);  Surgeon: Therisa Bi, MD;  Location: [facility] ENDOSCOPY;  Service: Gastroenterology;  Laterality: N/A;   ESOPHAGOSCOPY WITH DILITATION  03/25/2024   Procedure: ESOPHAGOSCOPY, WITH DILATION;  Surgeon: Therisa Bi, MD;  Location: [facility] ENDOSCOPY;  Service: Gastroenterology;;   NO PAST SURGERIES     Medication List:  Current Outpatient Medications  Medication Sig Dispense Refill   ALPRAZolam  (XANAX ) 0.5 MG tablet Take 1 tablet (0.5 mg total) by mouth 3 (three) times daily as needed for anxiety. Keep scheduled appointment 03/27/24 30 tablet 0   omeprazole (PRILOSEC) 40 MG capsule Take 40 mg by mouth daily.     No  current facility-administered medications for this visit.   Allergies: Allergies  Allergen Reactions   Cephalexin    Social History: Social History   Socioeconomic History   Marital status: Significant  Other    Spouse name: Not on file   Number of children: Not on file   Years of education: Not on file   Highest education level: Master's degree (e.g., MA, MS, MEng, MEd, MSW, MBA)  Occupational History   Not on file  Tobacco Use   Smoking status: Never   Smokeless tobacco: Never  Vaping Use   Vaping status: Never Used  Substance and Sexual Activity   Alcohol use: Yes    Alcohol/week: 2.0 standard drinks of alcohol    Types: 2 Cans of beer per week    Comment: Once a week   Drug use: No   Sexual activity: Yes    Birth control/protection: None  Other Topics Concern   Not on file  Social History Narrative   Not on file   Social Drivers of Health   Financial Resource Strain: Medium Risk (03/24/2024)   Overall Financial Resource Strain (CARDIA)    Difficulty of Paying Living Expenses: Somewhat hard  Food Insecurity: No Food Insecurity (03/24/2024)   Hunger Vital Sign    Worried About Running Out of Food in the Last Year: Never true    Ran Out of Food in the Last Year: Never true  Recent Concern: Food Insecurity - Food Insecurity Present (03/16/2024)   Received from [facility]   Hunger Vital Sign    Within the past 12 months, you worried that your food would run out before you got the money to buy more.: Sometimes true    Within the past 12 months, the food you bought just didn't last and you didn't have money to get more.: Never true  Transportation Needs: No Transportation Needs (03/24/2024)   PRAPARE - Administrator, Civil Service (Medical): No    Lack of Transportation (Non-Medical): No  Physical Activity: Inactive (03/24/2024)   Exercise Vital Sign    Days of Exercise per Week: 0 days    Minutes of Exercise per Session: Not on file   Stress: Stress Concern Present (03/24/2024)   Erin Vance    Feeling of Stress: Rather much  Social Connections: Moderately Integrated (03/24/2024)   Social Connection and Isolation Panel    Frequency of Communication with Friends and Family: More than three times a week    Frequency of Social Gatherings with Friends and Family: Once a week    Attends Religious Services: More than 4 times per year    Active Member of [employer] or Organizations: Yes    Attends Banker Meetings: More than 4 times per year    Marital Status: Widowed   Lives in a ***. Smoking: *** Occupation: ***  Environmental HistorySurveyor, minerals in the house: Network engineer in the family room: {Blank single:19197::yes,no} Carpet in the bedroom: {Blank single:19197::yes,no} Heating: {Blank single:19197::electric,gas,heat pump} Cooling: {Blank single:19197::central,window,heat pump} Pet: {Blank single:19197::yes ***,no}  Family History: Family History  Problem Relation Age of Onset   Hypertension Mother    Kidney cancer Mother 69       Kidney removal   Valvular heart disease Mother    Heart attack Father    Cancer Father    Heart disease Father    Diabetes Sister    Heart disease Sister    Bell's palsy Sister    Congestive Heart Failure Sister    Diabetes Sister    ALS Maternal Uncle    Dementia Maternal Grandmother    Breast cancer Paternal Grandmother  Heart attack Paternal Grandfather    Problem                               Relation Asthma                                   *** Eczema                                *** Food allergy                          *** Allergic rhino conjunctivitis     ***  Review of Systems  Constitutional:  Negative for appetite change, chills, fever and unexpected weight change.  HENT:  Negative for congestion and rhinorrhea.   Eyes:   Negative for itching.  Respiratory:  Negative for cough, chest tightness, shortness of breath and wheezing.   Cardiovascular:  Negative for chest pain.  Gastrointestinal:  Negative for abdominal pain.  Genitourinary:  Negative for difficulty urinating.  Skin:  Negative for rash.  Neurological:  Negative for headaches.    Objective: There were no vitals taken for this visit. There is no height or weight on file to calculate BMI. Physical Exam Vitals and nursing note reviewed.  Constitutional:      Appearance: Normal appearance. She is well-developed.  HENT:     Head: Normocephalic and atraumatic.     Right Ear: Tympanic membrane and external ear normal.     Left Ear: Tympanic membrane and external ear normal.     Nose: Nose normal.     Mouth/Throat:     Mouth: Mucous membranes are moist.     Pharynx: Oropharynx is clear.  Eyes:     Conjunctiva/sclera: Conjunctivae normal.  Cardiovascular:     Rate and Rhythm: Normal rate and regular rhythm.     Heart sounds: Normal heart sounds. No murmur heard.    No friction rub. No gallop.  Pulmonary:     Effort: Pulmonary effort is normal.     Breath sounds: Normal breath sounds. No wheezing, rhonchi or rales.  Musculoskeletal:     Cervical back: Neck supple.  Skin:    General: Skin is warm.     Findings: No rash.  Neurological:     Mental Status: She is alert and oriented to person, place, and time.  Psychiatric:        Behavior: Behavior normal.    The plan was reviewed with the patient/family, and all questions/concerned were addressed.  It was my pleasure to see Lyris today and participate in her care. Please feel free to contact me with any questions or concerns.  Sincerely,  Orlan Cramp, DO Allergy & Immunology  [facility]  [facility] office: [phone] [facility] office: [phone]

## 2024-05-13 ENCOUNTER — Other Ambulatory Visit: Payer: Self-pay

## 2024-05-13 ENCOUNTER — Encounter: Payer: Self-pay | Admitting: Allergy

## 2024-05-13 ENCOUNTER — Ambulatory Visit: Admitting: Allergy

## 2024-05-13 VITALS — BP 120/78 | HR 76 | Temp 98.4°F | Resp 18 | Ht 61.0 in | Wt 157.7 lb

## 2024-05-13 DIAGNOSIS — J3089 Other allergic rhinitis: Secondary | ICD-10-CM | POA: Diagnosis not present

## 2024-05-13 DIAGNOSIS — K200 Eosinophilic esophagitis: Secondary | ICD-10-CM

## 2024-05-13 NOTE — Patient Instructions (Addendum)
 EoE: Discussed with patient that EoE is a T cell mediated process and skin prick testing does not necessarily identity EoE food triggers. However, studies have shown that milk, gluten, eggs and peanuts/tree nuts are major EoE triggers.  Ideally EGD with biopsy should be obtained after food elimination diet to monitor progress of eosinophils as clinical symptoms do not necessarily correlate with pathology.  Gave handout on eosinophilic esophagitis.  Read about Dupixent for EoE treatment.  If Erin Vance was not covered then sometimes we use other steroids to coat the esophagus lining. This is off label use. Continue omeprazole 40mg  once day - nothing to eat or drink for 20-30 minutes afterwards.   Follow up for skin testing if interested.  Skin testing instructions:  Return for allergy skin testing. Will make additional recommendations based on results. Make sure you don't take any antihistamines for 3 days before the skin testing appointment. Don't put any lotion on the back and arms on the day of testing.  Must be in good health and not ill. No vaccines/injections/antibiotics within the past 7 days.  Plan on being here for 30-60 minutes.

## 2024-05-26 NOTE — Progress Notes (Unsigned)
 Skin testing note  RE: Erin Vance MRN: [id number] DOB: 04-01-1981 Date of Office Visit: 05/27/2024  Referring provider: Donzella Lauraine SAILOR, DO Primary care provider: Myrla Jon HERO, MD  Chief Complaint: skin testing  History of Present Illness: I had the pleasure of seeing Erin Vance for a skin testing visit at the [facility] on 05/27/2024. She is a 43 y.o. female, who is being followed for EOE and allergic rhinitis. Her previous allergy office visit was on 05/13/2024 with Dr. Luke. Today is a skin testing visit.   Discussed the use of AI scribe software for clinical note transcription with the patient, who gave verbal consent to proceed.    She has a history of eosinophilic esophagitis. Skin testing was inconclusive due to a lack of reaction to the positive control, histamine. Despite repeated attempts with different histamine samples, no reaction was observed, indicating a nonreactive skin test.  She was recently started on omeprazole, a proton pump inhibitor, but has not been consistently taking it. No current symptoms of dysphagia or other swallowing difficulties are reported. Her initial endoscopy showed eosinophil counts above 20 in the esophagus.   She has not undergone further endoscopic evaluation since the initial diagnosis and is uncertain about future follow-up with her gastroenterologist. She finds it challenging to manage her condition due to the lack of identifiable triggers and the inconclusive nature of her allergy testing.     Assessment and Plan: [name] is a 43 y.o. female with: Eosinophilic esophagitis Past history - EOE confirmed by endoscopy with >20 eosinophils per high power field. Barium swallow indicated esophageal stricture. Currently on omeprazole with unknown benefit. Eohilia not covered.  Today's skin prick testing negative to food panel however the testing was inconclusive as positive control x 2 did not react either.  Confirmed no antihistamines in the past 3 days. Discussed with patient that EoE is a T cell mediated process and skin prick testing does not necessarily identity EoE food triggers. However, studies have shown that milk, gluten, eggs and peanuts/tree nuts are major EoE triggers.  Ideally EGD with biopsy should be obtained after food elimination diet to monitor progress of eosinophils as clinical symptoms do not necessarily correlate with pathology.  Read about Dupixent for EoE treatment.  Continue omeprazole 40mg  once day - nothing to eat or drink for 20-30 minutes afterwards.  Follow up with GI as well. I don't think you need labs for foods at this time as it is unlikely to alter treatment recommendations.    Other allergic rhinitis Occasionally takes claritin prn and may continue to do so.  Return if symptoms worsen or fail to improve.  No orders of the defined types were placed in this encounter.  Lab Orders  No laboratory test(s) ordered today    Diagnostics: Skin Testing: Food allergy panel. Today's skin testing negative to food panel however the testing was inconclusive. Histamine control negative x 2.  Results discussed with patient/family.  Food Adult Perc - 05/27/24 1300     Time Antigen Placed 1352    Allergen Manufacturer Jestine    Location Back    Number of allergen test 72     Control-buffer 50% Glycerol Negative    Control-Histamine Negative    1. Peanut Negative    2. Soybean Negative    3. Wheat Negative    4. Sesame Negative    5. Milk, Cow Negative    6. Casein Negative    7. Egg [place],  Chicken Negative    8. Shellfish Mix Negative    9. Fish Mix Negative    10. Cashew Negative    11. Walnut Food Negative    12. Almond Negative    13. Hazelnut Negative    14. Pecan Food Negative    15. Pistachio Negative    16. [place] Nut Negative    17. Coconut Negative    18. Trout Negative    19. Tuna Negative    20. Salmon Negative    21. Flounder Negative     22. Codfish Negative    23. Shrimp Negative    24. Crab Negative    25. Lobster Negative    26. Oyster Negative    27. Scallops Negative    28. Oat  Negative    29. Rice Negative    30. Barley Negative    31. Rye  Negative    32. Hops Negative    33. [place] Meat Negative    34. Chicken Meat Negative    35. Pork Negative    36. Beef Negative    37. Lamb Negative    38. Tomato Negative    39. White Potato Negative    40. Sweet Potato Negative    41. Pea, Green/English Negative    42. Navy Bean Negative    43. Green Beans Negative    44. Squash Negative    45. Green Pepper Negative    46. Mushrooms Negative    47. Onion Negative    48. Avocado Negative    49. Cabbage Negative    50. Carrots Negative    51. Celery Negative    52. Corn Negative    53. Cucumber Negative    54. Grape (White seedless) Negative    55. Orange  Negative    56. Lemon Negative    57. Banana Negative    58. Apple Negative    59. Peach Negative    60. Strawberry Negative    61. Blueberry Negative    62. Cherry Negative    63. Cantaloupe Negative    64. Watermelon Negative    65. Pineapple Negative    66. Chocolate/Cacao Bean Negative    67. Cinnamon Negative    68. Nutmeg Negative    69. Ginger Negative    70. Garlic Negative    71. Pepper, Black Negative    72. Mustard Negative          Previous notes and tests were reviewed. The plan was reviewed with the patient/family, and all questions/concerned were addressed.  It was my pleasure to see [name] today and participate in her care. Please feel free to contact me with any questions or concerns.  Sincerely,  Orlan Cramp, DO Allergy & Immunology  [facility]  [facility] office: [phone] [facility] office: [phone]

## 2024-05-27 ENCOUNTER — Encounter: Payer: Self-pay | Admitting: Allergy

## 2024-05-27 ENCOUNTER — Ambulatory Visit: Admitting: Allergy

## 2024-05-27 DIAGNOSIS — J3089 Other allergic rhinitis: Secondary | ICD-10-CM

## 2024-05-27 DIAGNOSIS — K200 Eosinophilic esophagitis: Secondary | ICD-10-CM | POA: Diagnosis not present

## 2024-05-27 DIAGNOSIS — Z0182 Encounter for allergy testing: Secondary | ICD-10-CM

## 2024-05-27 NOTE — Patient Instructions (Addendum)
 Today's skin testing negative to food panel however the testing was inconclusive.  Results given.  EoE: Discussed with patient that EoE is a T cell mediated process and skin prick testing does not necessarily identity EoE food triggers. However, studies have shown that milk, gluten, eggs and peanuts/tree nuts are major EoE triggers.  Ideally EGD with biopsy should be obtained after food elimination diet to monitor progress of eosinophils as clinical symptoms do not necessarily correlate with pathology.   Read about Dupixent for EoE treatment.  Continue omeprazole 40mg  once day - nothing to eat or drink for 20-30 minutes afterwards.  Follow up with GI as well. I don't think you need labs for foods at this time.  Follow up as needed.

## 2024-06-17 ENCOUNTER — Other Ambulatory Visit: Payer: Self-pay | Admitting: Family Medicine

## 2024-06-17 DIAGNOSIS — F4323 Adjustment disorder with mixed anxiety and depressed mood: Secondary | ICD-10-CM

## 2024-06-19 NOTE — Telephone Encounter (Signed)
 Requested medications are due for refill today.  yes  Requested medications are on the active medications list.  yes  Last refill. 03/02/2024 #30 0 rf  Future visit scheduled.   yes  Notes to clinic.  Refill not delegated.    Requested Prescriptions  Pending Prescriptions Disp Refills   ALPRAZolam  (XANAX ) 0.5 MG tablet [Pharmacy Med Name: ALPRAZOLAM  0.5 MG TABLET] 30 tablet 0    Sig: TAKE 1 TABLET BY MOUTH 3 TIMES DAILY AS NEEDED FOR ANXIETY. KEEP SCHEDULED APPOINTMENT 03/27/24     Not Delegated - Psychiatry: Anxiolytics/Hypnotics 2 Failed - 06/19/2024  2:00 PM      Failed - This refill cannot be delegated      Failed - Urine Drug Screen completed in last 360 days      Passed - Patient is not pregnant      Passed - Valid encounter within last 6 months    Recent Outpatient Visits           2 months ago Annual physical exam   [facility] Donzella Lauraine SAILOR, DO   9 months ago Upper respiratory tract infection, unspecified type   [facility] Gasper Nancyann BRAVO, MD

## 2024-06-23 NOTE — Telephone Encounter (Signed)
 LOV- 03/27/2024 NOV-09/25/2024 LRF- 03/02/2024 Outpatient Medication Detail   Disp Refills Start End   ALPRAZolam  (XANAX ) 0.5 MG tablet 30 tablet 0 03/02/2024 --   Sig - Route: Take 1 tablet (0.5 mg total) by mouth 3 (three) times daily as needed for anxiety. Keep scheduled appointment 03/27/24 - Oral   Sent to pharmacy as: ALPRAZolam  (XANAX ) 0.5 MG tablet   E-Prescribing Status: Receipt confirmed by pharmacy (03/02/2024  4:54 PM EDT)

## 2024-09-25 ENCOUNTER — Ambulatory Visit: Admitting: Family Medicine
